# Patient Record
Sex: Female | Born: 1935 | Race: White | Hispanic: No | Marital: Married | Smoking: Never smoker
Health system: Southern US, Community
[De-identification: ages and names within clinical notes are randomized; demographics above are authoritative.]

## PROBLEM LIST (undated history)

## (undated) DIAGNOSIS — I509 Heart failure, unspecified: Secondary | ICD-10-CM

## (undated) DIAGNOSIS — I4891 Unspecified atrial fibrillation: Secondary | ICD-10-CM

## (undated) DIAGNOSIS — E079 Disorder of thyroid, unspecified: Secondary | ICD-10-CM

## (undated) DIAGNOSIS — N289 Disorder of kidney and ureter, unspecified: Secondary | ICD-10-CM

## (undated) DIAGNOSIS — I447 Left bundle-branch block, unspecified: Secondary | ICD-10-CM

---

## 2017-10-27 ENCOUNTER — Emergency Department (HOSPITAL_COMMUNITY)
Admission: EM | Admit: 2017-10-27 | Discharge: 2017-10-27 | Disposition: A | Discharge status: Home / Self Care | Payer: Medicare Other | Attending: Emergency Medicine | Admitting: Emergency Medicine

## 2017-10-27 ENCOUNTER — Emergency Department (HOSPITAL_COMMUNITY): Payer: Medicare Other

## 2017-10-27 ENCOUNTER — Other Ambulatory Visit: Payer: Self-pay

## 2017-10-27 ENCOUNTER — Encounter (HOSPITAL_COMMUNITY): Payer: Self-pay | Admitting: Emergency Medicine

## 2017-10-27 DIAGNOSIS — I509 Heart failure, unspecified: Secondary | ICD-10-CM | POA: Diagnosis not present

## 2017-10-27 DIAGNOSIS — Z79899 Other long term (current) drug therapy: Secondary | ICD-10-CM | POA: Diagnosis not present

## 2017-10-27 DIAGNOSIS — R531 Weakness: Secondary | ICD-10-CM | POA: Diagnosis present

## 2017-10-27 DIAGNOSIS — E860 Dehydration: Secondary | ICD-10-CM | POA: Insufficient documentation

## 2017-10-27 HISTORY — DX: Left bundle-branch block, unspecified: I44.7

## 2017-10-27 HISTORY — DX: Disorder of kidney and ureter, unspecified: N28.9

## 2017-10-27 HISTORY — DX: Unspecified atrial fibrillation: I48.91

## 2017-10-27 HISTORY — DX: Heart failure, unspecified: I50.9

## 2017-10-27 HISTORY — DX: Disorder of thyroid, unspecified: E07.9

## 2017-10-27 LAB — BASIC METABOLIC PANEL
Anion gap: 8 (ref 5–15)
BUN: 31 mg/dL — ABNORMAL HIGH (ref 6–20)
CALCIUM: 9 mg/dL (ref 8.9–10.3)
CO2: 23 mmol/L (ref 22–32)
CREATININE: 1.58 mg/dL — AB (ref 0.44–1.00)
Chloride: 109 mmol/L (ref 101–111)
GFR calc Af Amer: 33 mL/min — ABNORMAL LOW (ref >60)
GFR calc non Af Amer: 28 mL/min — ABNORMAL LOW (ref >60)
GLUCOSE: 126 mg/dL — AB (ref 65–99)
Potassium: 4.8 mmol/L (ref 3.5–5.1)
Sodium: 140 mmol/L (ref 135–145)

## 2017-10-27 LAB — BLOOD GAS, VENOUS
ACID-BASE DEFICIT: 2.2 mmol/L — AB (ref 0.0–2.0)
BICARBONATE: 24.4 mmol/L (ref 20.0–28.0)
O2 Saturation: 53.5 %
PCO2 VEN: 53.4 mmHg (ref 44.0–60.0)
PH VEN: 7.282 (ref 7.250–7.430)
PO2 VEN: 32.2 mmHg (ref 32.0–45.0)
Patient temperature: 98.6

## 2017-10-27 LAB — I-STAT CHEM 8, ED
BUN: 30 mg/dL — AB (ref 6–20)
CALCIUM ION: 1.18 mmol/L (ref 1.15–1.40)
CHLORIDE: 108 mmol/L (ref 101–111)
Creatinine, Ser: 1.6 mg/dL — ABNORMAL HIGH (ref 0.44–1.00)
Glucose, Bld: 128 mg/dL — ABNORMAL HIGH (ref 65–99)
HEMATOCRIT: 32 % — AB (ref 36.0–46.0)
Hemoglobin: 10.9 g/dL — ABNORMAL LOW (ref 12.0–15.0)
POTASSIUM: 4.7 mmol/L (ref 3.5–5.1)
SODIUM: 139 mmol/L (ref 135–145)
TCO2: 24 mmol/L (ref 22–32)

## 2017-10-27 LAB — I-STAT TROPONIN, ED: Troponin i, poc: 0.01 ng/mL (ref 0.00–0.08)

## 2017-10-27 LAB — URINALYSIS, ROUTINE W REFLEX MICROSCOPIC
BILIRUBIN URINE: NEGATIVE
GLUCOSE, UA: NEGATIVE mg/dL
HGB URINE DIPSTICK: NEGATIVE
Ketones, ur: NEGATIVE mg/dL
Leukocytes, UA: NEGATIVE
Nitrite: NEGATIVE
Protein, ur: NEGATIVE mg/dL
SPECIFIC GRAVITY, URINE: 1.016 (ref 1.005–1.030)
pH: 5 (ref 5.0–8.0)

## 2017-10-27 LAB — TSH: TSH: 1.715 u[IU]/mL (ref 0.350–4.500)

## 2017-10-27 LAB — CBG MONITORING, ED: GLUCOSE-CAPILLARY: 119 mg/dL — AB (ref 65–99)

## 2017-10-27 LAB — CBC
HCT: 34.3 % — ABNORMAL LOW (ref 36.0–46.0)
HEMOGLOBIN: 10.9 g/dL — AB (ref 12.0–15.0)
MCH: 31.2 pg (ref 26.0–34.0)
MCHC: 31.8 g/dL (ref 30.0–36.0)
MCV: 98.3 fL (ref 78.0–100.0)
PLATELETS: 245 10*3/uL (ref 150–400)
RBC: 3.49 MIL/uL — ABNORMAL LOW (ref 3.87–5.11)
RDW: 15.1 % (ref 11.5–15.5)
WBC: 6.5 10*3/uL (ref 4.0–10.5)

## 2017-10-27 LAB — DIGOXIN LEVEL: Digoxin Level: 1.4 ng/mL (ref 0.8–2.0)

## 2017-10-27 MED ORDER — SODIUM CHLORIDE 0.9 % IV BOLUS (SEPSIS)
1000.0000 mL | Freq: Once | INTRAVENOUS | Status: AC
  Administered 2017-10-27: 1000 mL via INTRAVENOUS

## 2017-10-27 NOTE — ED Notes (Signed)
Delay in blood and urine collection due to pt not being in room.

## 2017-10-27 NOTE — ED Notes (Signed)
Pt refused to change clothes.

## 2017-10-27 NOTE — ED Notes (Signed)
Pt states she is not claustrophobic and that she is willing to put our hospital gown on for testing. Made Enid Derrythan in MRI aware.

## 2017-10-27 NOTE — ED Provider Notes (Signed)
[facility]-EMERGENCY DEPT Provider Note   CSN: [id number] Arrival date & time: 10/27/17  1626     History   Chief Complaint Chief Complaint  Patient presents with  . Weakness    HPI Lisa Griffin is a 83 y.o. female.  83 yo F with a chief complaint of weakness.  This is been off and on since October.  The patient has been in the hospital twice in [place] for the same.  Initially it was thought to be acute kidney injury she received IV hydration and had improvement and was discharged.  She has back then at her baseline.  A couple weeks later she had a recurrence of her symptoms.  At that time she was found to be in A. fib with RVR.  Was started on digoxin and diltiazem.  She had had improvement until about a week ago.  At that time she was ambulatory on her own and was able to do most things with minimal assistance.  Now the patient is having significant difficulty in ambulating requiring assistance with feeding and cleaning.  They deny fevers or chills.  Denies cough or congestion.  Denies abdominal pain.  The patient did initially have nausea in October though this has since resolved.  She is able to eat though she is eaten and drank significantly less than normal.   The history is provided by the patient, a relative and medical records.  Illness  This is a new problem. The current episode started more than 1 week ago. The problem occurs constantly. The problem has been gradually worsening. Pertinent negatives include no chest pain, no headaches and no shortness of breath. Nothing aggravates the symptoms. Nothing relieves the symptoms. She has tried nothing for the symptoms. The treatment provided no relief.    Past Medical History:  Diagnosis Date  . CHF (congestive heart failure) (HCC)   . LBBB (left bundle branch block)   . Rapid atrial fibrillation (HCC)   . Renal disorder   . Thyroid disease     There are no active problems to display for this  patient.   History reviewed. No pertinent surgical history.  OB History    No data available       Home Medications    Prior to Admission medications   Medication Sig Start Date End Date Taking? Authorizing Provider  benazepril (LOTENSIN) 20 MG tablet Take 20 mg daily by mouth.  07/26/17  Yes [provider]  chlordiazePOXIDE-amitriptyline (LIMBITROL DS) 10-25 MG TABS tablet Take 1 tablet every evening by mouth.   Yes [provider]  Cholecalciferol (VITAMIN D3) 1000 units CAPS Take 1 capsule daily by mouth.    Yes [provider]  digoxin (LANOXIN) 0.125 MG tablet Take 0.125 mg every evening by mouth.  10/14/17  Yes [provider]  diltiazem (TIAZAC) 240 MG 24 hr capsule Take 240 mg daily by mouth.  10/14/17  Yes [provider]  ELIQUIS 2.5 MG TABS tablet Take 2.5 mg 2 (two) times daily by mouth.  10/14/17  Yes [provider]  escitalopram (LEXAPRO) 10 MG tablet Take 10 mg daily by mouth.  09/20/17  Yes [provider]  estradiol (ESTRACE) 2 MG tablet Take 2 mg daily by mouth.  07/31/17  Yes [provider]  gabapentin (NEURONTIN) 100 MG capsule Take 100 mg at bedtime by mouth.  07/26/17  Yes [provider]  HYDROcodone-acetaminophen (NORCO) 10-325 MG tablet Take 1 tablet every 6 (six) hours  as needed by mouth for moderate pain or severe pain.  09/26/17  Yes [provider]  levothyroxine (SYNTHROID, LEVOTHROID) 75 MCG tablet Take 75 mcg daily before breakfast by mouth.   Yes [provider]  omeprazole (PRILOSEC) 40 MG capsule Take 40 mg 2 (two) times daily by mouth.   Yes [provider]  ondansetron (ZOFRAN-ODT) 4 MG disintegrating tablet Take 4 mg every 8 (eight) hours as needed by mouth for nausea or vomiting.  09/13/17  Yes [provider]  polyethylene glycol (MIRALAX / GLYCOLAX) packet Take 17 g daily as needed by mouth for moderate constipation.    Yes [provider]  pravastatin (PRAVACHOL) 20 MG tablet Take 20 mg every evening by mouth.  08/31/17  Yes [provider]  amLODipine (NORVASC) 10 MG tablet Take 10 mg daily by mouth.     [provider]  carbonyl iron (FEOSOL) 45 MG TABS tablet Take by mouth.    [provider]  senna-docusate (SENOKOT-S) 8.6-50 MG tablet Take by mouth. 07/13/15   [provider]  sucralfate (CARAFATE) 1 g tablet  10/07/17   [provider]    Family History No family history on file.  Social History Social History   Tobacco Use  . Smoking status: Never Smoker  Substance Use Topics  . Alcohol use: No    Frequency: Never  . Drug use: No     Allergies   Patient has no known allergies.   Review of Systems Review of Systems  Constitutional: Positive for fatigue. Negative for chills and fever.  HENT: Negative for congestion and rhinorrhea.   Eyes: Negative for redness and visual disturbance.  Respiratory: Negative for shortness of breath and wheezing.   Cardiovascular: Negative for chest pain and palpitations.  Gastrointestinal: Negative for nausea and vomiting.  Genitourinary: Negative for dysuria and urgency.  Musculoskeletal: Negative for arthralgias and myalgias.  Skin: Negative for pallor and wound.  Neurological: Positive for weakness (generalized). Negative for dizziness and headaches.     Physical Exam Updated Vital Signs BP (!) 126/52 (BP Location: Left Wrist)   Pulse 62   Temp 98.1 F (36.7 C) (Oral)   Resp 18   Ht 5\' 5"  (1.651 m)   Wt 59 kg (130 lb)   SpO2 95%   BMI 21.63 kg/m   Physical Exam  Constitutional: She is oriented to person, place, and time. She appears well-developed and well-nourished. No distress.  HENT:  Head: Normocephalic and atraumatic.  Eyes: EOM are normal. Pupils are equal, round, and reactive to light.  Neck: Normal range of motion. Neck supple.  Cardiovascular: Normal rate and regular rhythm. Exam  reveals no gallop and no friction rub.  No murmur heard. Pulmonary/Chest: Effort normal. She has no wheezes. She has no rales.  Abdominal: Soft. She exhibits no distension and no mass. There is no tenderness. There is no guarding.  Musculoskeletal: She exhibits no edema or tenderness.  Neurological: She is alert and oriented to person, place, and time. No cranial nerve deficit or sensory deficit. GCS eye subscore is 4. GCS verbal subscore is 4. GCS motor subscore is 6.  Patient has very straight and determined line from finger to nose however she consistently is to the right and lower than her intended location.  She is also unable to touch her own nose on multiple occasions where she is almost there and then it drops about 1-2 inches and then she touches her lip.  Skin: Skin is  warm and dry. She is not diaphoretic.  Psychiatric: She has a normal mood and affect. Her behavior is normal.  Nursing note and vitals reviewed.    ED Treatments / Results  Labs (all labs ordered are listed, but only abnormal results are displayed) Labs Reviewed  BASIC METABOLIC PANEL - Abnormal; Notable for the following components:      Result Value   Glucose, Bld 126 (*)    BUN 31 (*)    Creatinine, Ser 1.58 (*)    GFR calc non Af Amer 28 (*)    GFR calc Af Amer 33 (*)    All other components within normal limits  CBC - Abnormal; Notable for the following components:   RBC 3.49 (*)    Hemoglobin 10.9 (*)    HCT 34.3 (*)    All other components within normal limits  BLOOD GAS, VENOUS - Abnormal; Notable for the following components:   Acid-base deficit 2.2 (*)    All other components within normal limits  CBG MONITORING, ED - Abnormal; Notable for the following components:   Glucose-Capillary 119 (*)    All other components within normal limits  I-STAT CHEM 8, ED - Abnormal; Notable for the following components:   BUN 30 (*)    Creatinine, Ser 1.60 (*)    Glucose, Bld 128 (*)    Hemoglobin 10.9 (*)      HCT 32.0 (*)    All other components within normal limits  URINALYSIS, ROUTINE W REFLEX MICROSCOPIC  DIGOXIN LEVEL  TSH  T4  I-STAT TROPONIN, ED    EKG  EKG Interpretation  Date/Time:  Thursday October 27 2017 17:02:52 EST Ventricular Rate:  72 PR Interval:    QRS Duration: 138 QT Interval:  411 QTC Calculation: 450 R Axis:   -22 Text Interpretation:  Ectopic atrial rhythm Short PR interval Left bundle branch block No old tracing to compare Confirmed by Melene PlanFloyd, Audreena Sachdeva [phone]([id number]) on 10/27/2017 5:43:31 PM       Radiology Dg Chest 2 View  Result Date: 10/27/2017 CLINICAL DATA:  Weakness EXAM: CHEST  2 VIEW COMPARISON:  None. FINDINGS: Mild elevation of the right diaphragm. Streaky left basilar opacity. No pleural effusion. Mild cardiomegaly. Slight asymmetric fullness of the right hilum could relate to enlarged pulmonary vessels. Atherosclerosis of the aorta. No pneumothorax. Mild wedging at the thoracolumbar junction IMPRESSION: 1. Streaky left lower lobe opacity may reflect atelectasis or mild pneumonia. 2. Cardiomegaly without edema Electronically Signed   By: Jasmine PangKim  Fujinaga M.D.   On: 10/27/2017 18:26   Ct Head Wo Contrast  Result Date: 10/27/2017 CLINICAL DATA:  Progressive weakness with poor appetite EXAM: CT HEAD WITHOUT CONTRAST TECHNIQUE: Contiguous axial images were obtained from the base of the skull through the vertex without intravenous contrast. COMPARISON:  None. FINDINGS: Brain: No acute territorial infarction, hemorrhage or intracranial mass is visualized. Probable old lacunar infarcts within the bilateral basal ganglia. Moderate atrophy. Ventricles are nonenlarged. Vascular: No hyperdense vessels.  Carotid artery calcification. Skull: Normal. Negative for fracture or focal lesion. Sinuses/Orbits: No acute finding. Other: None IMPRESSION: 1. No definite CT evidence for acute intracranial abnormality. 2. Probable old lacunar infarcts in the bilateral basal ganglia. Atrophy.  Electronically Signed   By: Jasmine PangKim  Fujinaga M.D.   On: 10/27/2017 18:31   Mr Brain Wo Contrast  Result Date: 10/27/2017 CLINICAL DATA:  Progressive weakness, poor appetite, unsteady gait. EXAM: MRI HEAD WITHOUT CONTRAST TECHNIQUE: Multiplanar, multiecho pulse sequences of the brain and surrounding structures were  obtained without intravenous contrast. COMPARISON:  CT HEAD October 27, 2017 at 1823 hours FINDINGS: BRAIN: No reduced diffusion to suggest acute ischemia. No susceptibility artifact to suggest hemorrhage. The ventricles and sulci are normal for patient's age. Old bilateral basal ganglia lacunar infarcts. A few scattered subcentimeter supratentorial white matter FLAIR T2 hyperintensities, faint T2 hyperintense signal within the pons. Old small cerebellar infarct . No suspicious parenchymal signal, mass or mass effect. No abnormal extra-axial fluid collections. VASCULAR: Normal major intracranial vascular flow voids present at skull base. SKULL AND UPPER CERVICAL SPINE: No abnormal sellar expansion. No suspicious calvarial bone marrow signal. Craniocervical junction maintained. SINUSES/ORBITS: Trace mastoid effusions. Paranasal sinus are well aerated. The included ocular globes and orbital contents are non-suspicious. Status post bilateral ocular lens implants. OTHER: Patient is edentulous. IMPRESSION: 1. No acute intracranial process. 2. Mild chronic small vessel ischemic disease. 3. 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 Small LEFT cerebellar infarct . Electronically Signed   By: Awilda Metro M.D.   On: 10/27/2017 22:38    Procedures Procedures (including critical care time)  Medications Ordered in ED Medications  sodium chloride 0.9 % bolus 1,000 mL (0 mLs Intravenous Stopped 10/27/17 2003)     Initial Impression / Assessment and Plan / ED Course  I have reviewed the triage vital signs and the nursing notes.  Pertinent labs & imaging results that were available during my care of the  patient were reviewed by me and considered in my medical decision making (see chart for details).     83 yo F with a chief complaint of generalized weakness and change in mentation.  Patient has had a waxing and waning course with this over the past month.  Will obtain labs UA chest x-ray dig level CT of the head give a bolus of IV fluids.  Discussed care with Dr. Otelia Limes, based on my hx recommends MR.   MR negative.  D/c home.   11:10 PM:  I have discussed the diagnosis/risks/treatment options with the patient and family and believe the pt to be eligible for discharge home to follow-up with PCP, neuro. We also discussed returning to the ED immediately if new or worsening sx occur. We discussed the sx which are most concerning (e.g., sudden worsening pain, fever, inability to tolerate by mouth) that necessitate immediate return. Medications administered to the patient during their visit and any new prescriptions provided to the patient are listed below.  Medications given during this visit Medications  sodium chloride 0.9 % bolus 1,000 mL (0 mLs Intravenous Stopped 10/27/17 2003)     The patient appears reasonably screen and/or stabilized for discharge and I doubt any other medical condition or other [facility] requiring further screening, evaluation, or treatment in the ED at this time prior to discharge.    Final Clinical Impressions(s) / ED Diagnoses   Final diagnoses:  Dehydration  Weakness    ED Discharge Orders        Ordered    Ambulatory referral to Neurology    Comments:  Recurrent weakness, difficulty with finger to nose, - MRI   10/27/17 2307       Melene Plan, DO 10/27/17 2310

## 2017-10-27 NOTE — ED Notes (Signed)
ED Provider at bedside. 

## 2017-10-27 NOTE — Discharge Instructions (Signed)
Eat and drink well for the next couple of days.  Return for worsening symptoms.  °

## 2017-10-27 NOTE — ED Notes (Signed)
In and out delay due to registration in the room.

## 2017-10-27 NOTE — ED Notes (Signed)
Pt wheeled to family vehicle.  Family verbalized understanding of discharge instructions.

## 2017-10-27 NOTE — ED Triage Notes (Addendum)
Pt son in law verbalizes pt progressively weak, poor appetite, and unsteady gait; pt alert to self, place, and situation; not oriented to date. Pt denies pain or symptoms but appears fatigue.

## 2017-10-28 LAB — T4: T4 TOTAL: 7.5 ug/dL (ref 4.5–12.0)

## 2017-11-09 ENCOUNTER — Emergency Department (HOSPITAL_COMMUNITY): Payer: Medicare Other

## 2017-11-09 ENCOUNTER — Emergency Department (HOSPITAL_COMMUNITY)
Admission: EM | Admit: 2017-11-09 | Discharge: 2017-11-09 | Disposition: A | Discharge status: Home / Self Care | Payer: Medicare Other | Attending: Emergency Medicine | Admitting: Emergency Medicine

## 2017-11-09 ENCOUNTER — Other Ambulatory Visit: Payer: Self-pay

## 2017-11-09 ENCOUNTER — Encounter (HOSPITAL_COMMUNITY): Payer: Self-pay

## 2017-11-09 DIAGNOSIS — Z7902 Long term (current) use of antithrombotics/antiplatelets: Secondary | ICD-10-CM | POA: Insufficient documentation

## 2017-11-09 DIAGNOSIS — I509 Heart failure, unspecified: Secondary | ICD-10-CM | POA: Diagnosis not present

## 2017-11-09 DIAGNOSIS — J181 Lobar pneumonia, unspecified organism: Secondary | ICD-10-CM | POA: Insufficient documentation

## 2017-11-09 DIAGNOSIS — Z7901 Long term (current) use of anticoagulants: Secondary | ICD-10-CM | POA: Insufficient documentation

## 2017-11-09 DIAGNOSIS — R531 Weakness: Secondary | ICD-10-CM | POA: Diagnosis present

## 2017-11-09 DIAGNOSIS — J189 Pneumonia, unspecified organism: Secondary | ICD-10-CM

## 2017-11-09 DIAGNOSIS — Z79899 Other long term (current) drug therapy: Secondary | ICD-10-CM | POA: Diagnosis not present

## 2017-11-09 LAB — CBC
HCT: 37.2 % (ref 36.0–46.0)
Hemoglobin: 12.1 g/dL (ref 12.0–15.0)
MCH: 31.4 pg (ref 26.0–34.0)
MCHC: 32.5 g/dL (ref 30.0–36.0)
MCV: 96.6 fL (ref 78.0–100.0)
Platelets: 140 10*3/uL — ABNORMAL LOW (ref 150–400)
RBC: 3.85 MIL/uL — ABNORMAL LOW (ref 3.87–5.11)
RDW: 14.7 % (ref 11.5–15.5)
WBC: 6 10*3/uL (ref 4.0–10.5)

## 2017-11-09 LAB — COMPREHENSIVE METABOLIC PANEL
ALT: 12 U/L — ABNORMAL LOW (ref 14–54)
AST: 20 U/L (ref 15–41)
Albumin: 3.1 g/dL — ABNORMAL LOW (ref 3.5–5.0)
Alkaline Phosphatase: 51 U/L (ref 38–126)
Anion gap: 6 (ref 5–15)
BUN: 19 mg/dL (ref 6–20)
CO2: 25 mmol/L (ref 22–32)
Calcium: 8.2 mg/dL — ABNORMAL LOW (ref 8.9–10.3)
Chloride: 109 mmol/L (ref 101–111)
Creatinine, Ser: 1.25 mg/dL — ABNORMAL HIGH (ref 0.44–1.00)
GFR calc Af Amer: 44 mL/min — ABNORMAL LOW (ref >60)
GFR calc non Af Amer: 38 mL/min — ABNORMAL LOW (ref >60)
Glucose, Bld: 100 mg/dL — ABNORMAL HIGH (ref 65–99)
Potassium: 4.4 mmol/L (ref 3.5–5.1)
Sodium: 140 mmol/L (ref 135–145)
Total Bilirubin: 0.9 mg/dL (ref 0.3–1.2)
Total Protein: 6 g/dL — ABNORMAL LOW (ref 6.5–8.1)

## 2017-11-09 LAB — CBG MONITORING, ED: Glucose-Capillary: 88 mg/dL (ref 65–99)

## 2017-11-09 MED ORDER — DEXTROSE 5 % IV SOLN
1.0000 g | Freq: Once | INTRAVENOUS | Status: AC
  Administered 2017-11-09: 1 g via INTRAVENOUS
  Filled 2017-11-09: qty 10

## 2017-11-09 MED ORDER — AZITHROMYCIN 250 MG PO TABS
500.0000 mg | ORAL_TABLET | Freq: Once | ORAL | Status: AC
  Administered 2017-11-09: 500 mg via ORAL
  Filled 2017-11-09: qty 2

## 2017-11-09 MED ORDER — AMOXICILLIN 500 MG PO CAPS
500.0000 mg | ORAL_CAPSULE | Freq: Two times a day (BID) | ORAL | 0 refills | Status: DC
Start: 2017-11-09 — End: 2018-01-07

## 2017-11-09 MED ORDER — AZITHROMYCIN 250 MG PO TABS: 250.0000 mg | ORAL_TABLET | Freq: Every day | ORAL | 0 refills | Status: DC

## 2017-11-09 NOTE — ED Notes (Signed)
Before discharge, assisted patient with using the bed pan. Patient had a bowel movement.

## 2017-11-09 NOTE — ED Triage Notes (Signed)
Pt arrived via EMS. Pt family stated pt was showing signs of dehydration and has hx of dehydration. Pt was showing increased weakness, and confusion. Pt was given 1000 ml of NS enroute and per EMS pt has had improved mental status. Pt family states that pt had a temp of 100.5 today. Pt has had a recent non productive cough. Pt O2 saturation was 89% on RA. Pt was placed on 2 lmp via nasal cannula, and O2 saturations improved to 95%. Pt has hx of AFIB( Eliquis) , CKD, HTN, , Pt has bilateral wounds on bilateral shins.   EMS V/S  BP 174/100, HR 78, RR 18, CBG 96

## 2017-11-09 NOTE — ED Notes (Signed)
Bed: [id number] Expected date:  Expected time:  Means of arrival:  Comments: EMS/confusion/dehydration

## 2017-11-09 NOTE — ED Provider Notes (Signed)
[facility]-EMERGENCY DEPT Provider Note   CSN: [id number] Arrival date & time: 11/09/17  1334     History   Chief Complaint Chief Complaint  Patient presents with  . Altered Mental Status  . Weakness    HPI Lisa Griffin is a 83 y.o. female.  HPI   83 year old female brought in by her son-in-law for evaluation increasing weakness.  He states that she has had these symptoms intermittently for years usually with dehydration.  He also noted a temperature of 100.5 earlier today.  Occasional nonproductive cough.  The past couple days really has not been eating much.  No reported falls or other trauma.  Patient has not been complaining of anything specifically aside from back pain which is chronic for her. No urinary complaints.   Past Medical History:  Diagnosis Date  . CHF (congestive heart failure) (HCC)   . LBBB (left bundle branch block)   . Rapid atrial fibrillation (HCC)   . Renal disorder   . Thyroid disease     There are no active problems to display for this patient.   History reviewed. No pertinent surgical history.  OB History    No data available       Home Medications    Prior to Admission medications   Medication Sig Start Date End Date Taking? Authorizing Provider  amLODipine (NORVASC) 10 MG tablet Take 10 mg daily by mouth.     [provider]  benazepril (LOTENSIN) 20 MG tablet Take 20 mg daily by mouth.  07/26/17   [provider]  carbonyl iron (FEOSOL) 45 MG TABS tablet Take by mouth.    [provider]  chlordiazePOXIDE-amitriptyline (LIMBITROL DS) 10-25 MG TABS tablet Take 1 tablet every evening by mouth.    [provider]  Cholecalciferol (VITAMIN D3) 1000 units CAPS Take 1 capsule daily by mouth.     [provider]  digoxin (LANOXIN) 0.125 MG tablet Take 0.125 mg every evening by mouth.  10/14/17   [provider]  diltiazem (TIAZAC) 240 MG 24 hr capsule Take 240 mg daily  by mouth.  10/14/17   [provider]  ELIQUIS 2.5 MG TABS tablet Take 2.5 mg 2 (two) times daily by mouth.  10/14/17   [provider]  escitalopram (LEXAPRO) 10 MG tablet Take 10 mg daily by mouth.  09/20/17   [provider]  estradiol (ESTRACE) 2 MG tablet Take 2 mg daily by mouth.  07/31/17   [provider]  gabapentin (NEURONTIN) 100 MG capsule Take 100 mg at bedtime by mouth.  07/26/17   [provider]  HYDROcodone-acetaminophen (NORCO) 10-325 MG tablet Take 1 tablet every 6 (six) hours as needed by mouth for moderate pain or severe pain.  09/26/17   [provider]  levothyroxine (SYNTHROID, LEVOTHROID) 75 MCG tablet Take 75 mcg daily before breakfast by mouth.    [provider]  omeprazole (PRILOSEC) 40 MG capsule Take 40 mg 2 (two) times daily by mouth.    [provider]  ondansetron (ZOFRAN-ODT) 4 MG disintegrating tablet Take 4 mg every 8 (eight) hours as needed by mouth for nausea or vomiting.  09/13/17   [provider]  polyethylene glycol (MIRALAX / GLYCOLAX) packet Take 17 g daily as needed by mouth for moderate constipation.     [provider]  pravastatin (PRAVACHOL) 20 MG tablet Take 20 mg every evening by mouth.  08/31/17   [provider]  senna-docusate (SENOKOT-S) 8.6-50  MG tablet Take by mouth. 07/13/15   [provider]  sucralfate (CARAFATE) 1 g tablet  10/07/17   [provider]    Family History History reviewed. No pertinent family history.  Social History Social History   Tobacco Use  . Smoking status: Never Smoker  . Smokeless tobacco: Never Used  Substance Use Topics  . Alcohol use: No    Frequency: Never  . Drug use: No     Allergies   Patient has no known allergies.   Review of Systems Review of Systems All systems reviewed and negative, other than as noted in HPI.   Physical Exam Updated Vital Signs BP (!) 154/54 (BP Location:  Right Arm)   Pulse 79   Temp 98.8 F (37.1 C) (Oral)   Resp 20   SpO2 94%   Physical Exam  Constitutional: She appears well-developed and well-nourished. No distress.  HENT:  Head: Normocephalic and atraumatic.  Eyes: Conjunctivae are normal. Right eye exhibits no discharge. Left eye exhibits no discharge.  Neck: Neck supple.  Cardiovascular: Normal rate and normal heart sounds. Exam reveals no gallop and no friction rub.  No murmur heard. Pulmonary/Chest: Effort normal and breath sounds normal. No respiratory distress.  Abdominal: Soft. She exhibits no distension. There is no tenderness.  Genitourinary:  Genitourinary Comments: Lower extremities symmetric as compared to each other. No calf tenderness. Negative Homan's. No palpable cords.   Musculoskeletal: She exhibits no edema or tenderness.  Neurological:  Sleepy, but opens eyes to voice.  Answers questions.  Follows commands.  No focal deficit noted.  Skin: Skin is warm and dry.  Small wounds which is essentially amount to ulcerations to the anterior left shin and over the lateral malleolus of the right ankle.  Neither appear infected.  Psychiatric: She has a normal mood and affect. Her behavior is normal. Thought content normal.  Nursing note and vitals reviewed.    ED Treatments / Results  Labs (all labs ordered are listed, but only abnormal results are displayed) Labs Reviewed  COMPREHENSIVE METABOLIC PANEL - Abnormal; Notable for the following components:      Result Value   Glucose, Bld 100 (*)    Creatinine, Ser 1.25 (*)    Calcium 8.2 (*)    Total Protein 6.0 (*)    Albumin 3.1 (*)    ALT 12 (*)    GFR calc non Af Amer 38 (*)    GFR calc Af Amer 44 (*)    All other components within normal limits  CBC - Abnormal; Notable for the following components:   RBC 3.85 (*)    Platelets 140 (*)    All other components within normal limits  CBG MONITORING, ED    EKG  EKG Interpretation  Date/Time:  Wednesday  November 09 2017 14:03:44 EST Ventricular Rate:  72 PR Interval:    QRS Duration: 155 QT Interval:  385 QTC Calculation: 422 R Axis:   12 Text Interpretation:  Atrial fibrillation Artifact Left bundle branch block Confirmed by Raeford RazorKohut, Kensly Bowmer [phone]([id number]) on 11/09/2017 2:58:29 PM       Radiology Dg Chest 2 View  Result Date: 11/09/2017 CLINICAL DATA:  Dehydration, weakness, low grade temperature. Cough. Dyspnea. EXAM: CHEST  2 VIEW COMPARISON:  Two-view chest x-ray 10/27/2017 FINDINGS: The heart is enlarged. Diffuse interstitial and airspace disease is now present. Small effusions are present. Airspace disease is most concentrated in the left lower lobe. The visualized soft tissues and bony thorax are unremarkable. IMPRESSION: 1.  Left lower lobe pneumonia. 2. Increase in diffuse interstitial and airspace disease likely representing some component of edema and possibly congestive heart failure as well. Electronically Signed   By: Marin Roberts M.D.   On: 11/09/2017 15:23    Procedures Procedures (including critical care time)  Medications Ordered in ED Medications - No data to display   Initial Impression / Assessment and Plan / ED Course  I have reviewed the triage vital signs and the nursing notes.  Pertinent labs & imaging results that were available during my care of the patient were reviewed by me and considered in my medical decision making (see chart for details).     89yF with decreased mental status. Reported fever to 100.5. Occasional cough. CXR with likely pneumonia. Offered admission. Son-in-law declining. To his credit, he would like trying to take care of her at home. o2 sats mildly low in mid 90s on RA, but seems to be breathing comfortably. Renal function improved from last labs. No vomiting. Will DC after dose of abx in ED. Return precautions were discussed.   Final Clinical Impressions(s) / ED Diagnoses   Final diagnoses:  Community acquired pneumonia of left  lower lobe of lung [facility])    ED Discharge Orders        Ordered    azithromycin (ZITHROMAX) 250 MG tablet  Daily     11/09/17 1600    amoxicillin (AMOXIL) 500 MG capsule  2 times daily     11/09/17 1600       Raeford Razor, MD 11/10/17 [phone]

## 2018-01-04 ENCOUNTER — Encounter (HOSPITAL_COMMUNITY): Payer: Self-pay | Admitting: Emergency Medicine

## 2018-01-04 ENCOUNTER — Emergency Department (HOSPITAL_COMMUNITY): Payer: Medicare Other

## 2018-01-04 ENCOUNTER — Emergency Department (HOSPITAL_COMMUNITY)
Admission: EM | Admit: 2018-01-04 | Discharge: 2018-01-04 | Discharge status: Against Medical Advice | Payer: Medicare Other | Source: Home / Self Care

## 2018-01-04 DIAGNOSIS — Z5321 Procedure and treatment not carried out due to patient leaving prior to being seen by health care provider: Secondary | ICD-10-CM

## 2018-01-04 DIAGNOSIS — E871 Hypo-osmolality and hyponatremia: Secondary | ICD-10-CM | POA: Diagnosis not present

## 2018-01-04 DIAGNOSIS — R0602 Shortness of breath: Secondary | ICD-10-CM | POA: Diagnosis not present

## 2018-01-04 NOTE — ED Triage Notes (Signed)
Patient has productive cough for 10 days. Reports sputum today become really dark and patient gets almost choked when coughing really hard. Family reports that not taking fluids as should so could be dehydrated. Is prone to getting PNA per son in law.

## 2018-01-04 NOTE — ED Notes (Signed)
Pt informed registration clerk they were leaving

## 2018-01-05 ENCOUNTER — Inpatient Hospital Stay (HOSPITAL_COMMUNITY)
Admission: EM | Admit: 2018-01-05 | Discharge: 2018-01-07 | DRG: 641 | Disposition: A | Discharge status: Home Health | Payer: Medicare Other | Attending: Internal Medicine | Admitting: Internal Medicine

## 2018-01-05 ENCOUNTER — Observation Stay (HOSPITAL_COMMUNITY): Payer: Medicare Other

## 2018-01-05 ENCOUNTER — Encounter (HOSPITAL_COMMUNITY): Payer: Self-pay | Admitting: Emergency Medicine

## 2018-01-05 ENCOUNTER — Other Ambulatory Visit: Payer: Self-pay

## 2018-01-05 ENCOUNTER — Ambulatory Visit: Payer: Medicare Other | Admitting: Podiatry

## 2018-01-05 DIAGNOSIS — Z7901 Long term (current) use of anticoagulants: Secondary | ICD-10-CM

## 2018-01-05 DIAGNOSIS — R0602 Shortness of breath: Secondary | ICD-10-CM | POA: Diagnosis not present

## 2018-01-05 DIAGNOSIS — E785 Hyperlipidemia, unspecified: Secondary | ICD-10-CM | POA: Diagnosis present

## 2018-01-05 DIAGNOSIS — R059 Cough, unspecified: Secondary | ICD-10-CM

## 2018-01-05 DIAGNOSIS — I5032 Chronic diastolic (congestive) heart failure: Secondary | ICD-10-CM | POA: Diagnosis present

## 2018-01-05 DIAGNOSIS — R339 Retention of urine, unspecified: Secondary | ICD-10-CM | POA: Diagnosis present

## 2018-01-05 DIAGNOSIS — J209 Acute bronchitis, unspecified: Secondary | ICD-10-CM | POA: Diagnosis present

## 2018-01-05 DIAGNOSIS — Z885 Allergy status to narcotic agent status: Secondary | ICD-10-CM

## 2018-01-05 DIAGNOSIS — E878 Other disorders of electrolyte and fluid balance, not elsewhere classified: Secondary | ICD-10-CM

## 2018-01-05 DIAGNOSIS — R05 Cough: Secondary | ICD-10-CM | POA: Diagnosis not present

## 2018-01-05 DIAGNOSIS — I4892 Unspecified atrial flutter: Secondary | ICD-10-CM | POA: Diagnosis present

## 2018-01-05 DIAGNOSIS — Z8709 Personal history of other diseases of the respiratory system: Secondary | ICD-10-CM

## 2018-01-05 DIAGNOSIS — E860 Dehydration: Secondary | ICD-10-CM | POA: Diagnosis present

## 2018-01-05 DIAGNOSIS — Z888 Allergy status to other drugs, medicaments and biological substances status: Secondary | ICD-10-CM

## 2018-01-05 DIAGNOSIS — N189 Chronic kidney disease, unspecified: Secondary | ICD-10-CM | POA: Diagnosis present

## 2018-01-05 DIAGNOSIS — I480 Paroxysmal atrial fibrillation: Secondary | ICD-10-CM | POA: Diagnosis present

## 2018-01-05 DIAGNOSIS — I482 Chronic atrial fibrillation: Secondary | ICD-10-CM | POA: Diagnosis present

## 2018-01-05 DIAGNOSIS — I130 Hypertensive heart and chronic kidney disease with heart failure and stage 1 through stage 4 chronic kidney disease, or unspecified chronic kidney disease: Secondary | ICD-10-CM | POA: Diagnosis present

## 2018-01-05 DIAGNOSIS — E871 Hypo-osmolality and hyponatremia: Principal | ICD-10-CM | POA: Diagnosis present

## 2018-01-05 DIAGNOSIS — Z79890 Hormone replacement therapy (postmenopausal): Secondary | ICD-10-CM

## 2018-01-05 DIAGNOSIS — H9190 Unspecified hearing loss, unspecified ear: Secondary | ICD-10-CM | POA: Diagnosis present

## 2018-01-05 DIAGNOSIS — E039 Hypothyroidism, unspecified: Secondary | ICD-10-CM | POA: Diagnosis present

## 2018-01-05 DIAGNOSIS — I447 Left bundle-branch block, unspecified: Secondary | ICD-10-CM | POA: Diagnosis present

## 2018-01-05 LAB — CBC WITH DIFFERENTIAL/PLATELET
BASOS ABS: 0 10*3/uL (ref 0.0–0.1)
Basophils Relative: 0 %
Eosinophils Absolute: 0.1 10*3/uL (ref 0.0–0.7)
Eosinophils Relative: 1 %
HCT: 32.9 % — ABNORMAL LOW (ref 36.0–46.0)
Hemoglobin: 11.3 g/dL — ABNORMAL LOW (ref 12.0–15.0)
LYMPHS PCT: 24 %
Lymphs Abs: 1.8 10*3/uL (ref 0.7–4.0)
MCH: 30.4 pg (ref 26.0–34.0)
MCHC: 34.3 g/dL (ref 30.0–36.0)
MCV: 88.4 fL (ref 78.0–100.0)
Monocytes Absolute: 0.4 10*3/uL (ref 0.1–1.0)
Monocytes Relative: 6 %
Neutro Abs: 5.2 10*3/uL (ref 1.7–7.7)
Neutrophils Relative %: 69 %
Platelets: 159 10*3/uL (ref 150–400)
RBC: 3.72 MIL/uL — ABNORMAL LOW (ref 3.87–5.11)
RDW: 13.9 % (ref 11.5–15.5)
WBC: 7.5 10*3/uL (ref 4.0–10.5)

## 2018-01-05 LAB — BASIC METABOLIC PANEL
ANION GAP: 5 (ref 5–15)
ANION GAP: 5 (ref 5–15)
Anion gap: 7 (ref 5–15)
BUN: 15 mg/dL (ref 6–20)
BUN: 14 mg/dL (ref 6–20)
BUN: 12 mg/dL (ref 6–20)
CALCIUM: 8.5 mg/dL — AB (ref 8.9–10.3)
CHLORIDE: 96 mmol/L — AB (ref 101–111)
CHLORIDE: 97 mmol/L — AB (ref 101–111)
CO2: 24 mmol/L (ref 22–32)
CO2: 23 mmol/L (ref 22–32)
CO2: 23 mmol/L (ref 22–32)
Calcium: 8.4 mg/dL — ABNORMAL LOW (ref 8.9–10.3)
Calcium: 8.3 mg/dL — ABNORMAL LOW (ref 8.9–10.3)
Chloride: 93 mmol/L — ABNORMAL LOW (ref 101–111)
Creatinine, Ser: 1.04 mg/dL — ABNORMAL HIGH (ref 0.44–1.00)
Creatinine, Ser: 0.94 mg/dL (ref 0.44–1.00)
Creatinine, Ser: 0.97 mg/dL (ref 0.44–1.00)
GFR calc Af Amer: 54 mL/min — ABNORMAL LOW (ref >60)
GFR calc Af Amer: >60 mL/min (ref >60)
GFR calc Af Amer: 59 mL/min — ABNORMAL LOW (ref >60)
GFR calc non Af Amer: 53 mL/min — ABNORMAL LOW (ref >60)
GFR, EST NON AFRICAN AMERICAN: 47 mL/min — AB (ref >60)
GFR, EST NON AFRICAN AMERICAN: 51 mL/min — AB (ref >60)
GLUCOSE: 118 mg/dL — AB (ref 65–99)
GLUCOSE: 136 mg/dL — AB (ref 65–99)
Glucose, Bld: 140 mg/dL — ABNORMAL HIGH (ref 65–99)
POTASSIUM: 4.2 mmol/L (ref 3.5–5.1)
POTASSIUM: 4.6 mmol/L (ref 3.5–5.1)
Potassium: 5 mmol/L (ref 3.5–5.1)
Sodium: 124 mmol/L — ABNORMAL LOW (ref 135–145)
Sodium: 124 mmol/L — ABNORMAL LOW (ref 135–145)
Sodium: 125 mmol/L — ABNORMAL LOW (ref 135–145)

## 2018-01-05 LAB — I-STAT TROPONIN, ED: Troponin i, poc: 0.02 ng/mL (ref 0.00–0.08)

## 2018-01-05 LAB — BRAIN NATRIURETIC PEPTIDE: B NATRIURETIC PEPTIDE 5: 193.8 pg/mL — AB (ref 0.0–100.0)

## 2018-01-05 LAB — COMPREHENSIVE METABOLIC PANEL
ALT: 15 U/L (ref 14–54)
AST: 22 U/L (ref 15–41)
Albumin: 3.2 g/dL — ABNORMAL LOW (ref 3.5–5.0)
Alkaline Phosphatase: 64 U/L (ref 38–126)
Anion gap: 8 (ref 5–15)
BUN: 16 mg/dL (ref 6–20)
CHLORIDE: 89 mmol/L — AB (ref 101–111)
CO2: 24 mmol/L (ref 22–32)
Calcium: 8.9 mg/dL (ref 8.9–10.3)
Creatinine, Ser: 1.1 mg/dL — ABNORMAL HIGH (ref 0.44–1.00)
GFR, EST AFRICAN AMERICAN: 51 mL/min — AB (ref >60)
GFR, EST NON AFRICAN AMERICAN: 44 mL/min — AB (ref >60)
Glucose, Bld: 113 mg/dL — ABNORMAL HIGH (ref 65–99)
POTASSIUM: 4.7 mmol/L (ref 3.5–5.1)
Sodium: 121 mmol/L — ABNORMAL LOW (ref 135–145)
Total Bilirubin: 0.8 mg/dL (ref 0.3–1.2)
Total Protein: 6.1 g/dL — ABNORMAL LOW (ref 6.5–8.1)

## 2018-01-05 LAB — INFLUENZA PANEL BY PCR (TYPE A & B)
Influenza A By PCR: NEGATIVE
Influenza B By PCR: NEGATIVE

## 2018-01-05 LAB — I-STAT CG4 LACTIC ACID, ED
LACTIC ACID, VENOUS: 0.96 mmol/L (ref 0.5–1.9)
LACTIC ACID, VENOUS: 1.5 mmol/L (ref 0.5–1.9)

## 2018-01-05 MED ORDER — VITAMIN D 1000 UNITS PO TABS
1000.0000 [IU] | ORAL_TABLET | Freq: Every day | ORAL | Status: DC
  Administered 2018-01-05 – 2018-01-07 (×3): 1000 [IU] via ORAL
  Filled 2018-01-05 (×3): qty 1

## 2018-01-05 MED ORDER — DILTIAZEM HCL ER BEADS 240 MG PO CP24
240.0000 mg | ORAL_CAPSULE | Freq: Every day | ORAL | Status: DC
  Administered 2018-01-05 – 2018-01-06 (×2): 240 mg via ORAL
  Filled 2018-01-05 (×4): qty 1

## 2018-01-05 MED ORDER — ISOSORBIDE MONONITRATE ER 30 MG PO TB24
30.0000 mg | ORAL_TABLET | Freq: Every day | ORAL | Status: DC
  Administered 2018-01-05 – 2018-01-07 (×3): 30 mg via ORAL
  Filled 2018-01-05 (×3): qty 1

## 2018-01-05 MED ORDER — CARBONYL IRON 45 MG PO TABS
45.0000 mg | ORAL_TABLET | Freq: Every day | ORAL | Status: DC
  Filled 2018-01-05: qty 1

## 2018-01-05 MED ORDER — DIGOXIN 125 MCG PO TABS
0.1250 mg | ORAL_TABLET | Freq: Every evening | ORAL | Status: DC
  Administered 2018-01-05 – 2018-01-06 (×2): 0.125 mg via ORAL
  Filled 2018-01-05 (×3): qty 1

## 2018-01-05 MED ORDER — HYDRALAZINE HCL 20 MG/ML IJ SOLN: 10.0000 mg | Freq: Four times a day (QID) | INTRAMUSCULAR | Status: DC | PRN

## 2018-01-05 MED ORDER — ONDANSETRON 4 MG PO TBDP: 4.0000 mg | ORAL_TABLET | Freq: Three times a day (TID) | ORAL | Status: DC | PRN

## 2018-01-05 MED ORDER — GUAIFENESIN ER 600 MG PO TB12
600.0000 mg | ORAL_TABLET | Freq: Two times a day (BID) | ORAL | Status: DC
  Administered 2018-01-05 – 2018-01-07 (×4): 600 mg via ORAL
  Filled 2018-01-05 (×5): qty 1

## 2018-01-05 MED ORDER — HYDRALAZINE HCL 25 MG PO TABS
25.0000 mg | ORAL_TABLET | Freq: Three times a day (TID) | ORAL | Status: DC
  Administered 2018-01-05 – 2018-01-07 (×6): 25 mg via ORAL
  Filled 2018-01-05 (×8): qty 1

## 2018-01-05 MED ORDER — ALBUTEROL SULFATE (2.5 MG/3ML) 0.083% IN NEBU: 5.0000 mg | INHALATION_SOLUTION | RESPIRATORY_TRACT | Status: DC | PRN

## 2018-01-05 MED ORDER — DEXTROSE 5 % IV SOLN
1.0000 g | Freq: Once | INTRAVENOUS | Status: AC
  Administered 2018-01-05: 1 g via INTRAVENOUS
  Filled 2018-01-05: qty 10

## 2018-01-05 MED ORDER — ONDANSETRON HCL 4 MG PO TABS
4.0000 mg | ORAL_TABLET | Freq: Four times a day (QID) | ORAL | Status: DC | PRN
  Administered 2018-01-05: 4 mg via ORAL
  Filled 2018-01-05: qty 1

## 2018-01-05 MED ORDER — BENAZEPRIL HCL 20 MG PO TABS
20.0000 mg | ORAL_TABLET | Freq: Every day | ORAL | Status: DC
  Administered 2018-01-05 – 2018-01-07 (×3): 20 mg via ORAL
  Filled 2018-01-05 (×3): qty 1

## 2018-01-05 MED ORDER — AMLODIPINE BESYLATE 5 MG PO TABS: 10.0000 mg | ORAL_TABLET | Freq: Every day | ORAL | Status: DC

## 2018-01-05 MED ORDER — IPRATROPIUM-ALBUTEROL 0.5-2.5 (3) MG/3ML IN SOLN: 3.0000 mL | Freq: Four times a day (QID) | RESPIRATORY_TRACT | Status: DC | PRN

## 2018-01-05 MED ORDER — ALBUTEROL SULFATE (2.5 MG/3ML) 0.083% IN NEBU
5.0000 mg | INHALATION_SOLUTION | Freq: Once | RESPIRATORY_TRACT | Status: AC
  Administered 2018-01-05: 5 mg via RESPIRATORY_TRACT
  Filled 2018-01-05: qty 6

## 2018-01-05 MED ORDER — SODIUM CHLORIDE 0.9 % IV SOLN: 250.0000 mL | INTRAVENOUS | Status: DC | PRN

## 2018-01-05 MED ORDER — POLYETHYLENE GLYCOL 3350 17 G PO PACK: 17.0000 g | PACK | Freq: Every day | ORAL | Status: DC | PRN

## 2018-01-05 MED ORDER — SODIUM CHLORIDE 0.9 % IV BOLUS (SEPSIS)
1000.0000 mL | Freq: Once | INTRAVENOUS | Status: AC
  Administered 2018-01-05: 1000 mL via INTRAVENOUS

## 2018-01-05 MED ORDER — PRAVASTATIN SODIUM 20 MG PO TABS
20.0000 mg | ORAL_TABLET | Freq: Every evening | ORAL | Status: DC
  Administered 2018-01-05 – 2018-01-06 (×2): 20 mg via ORAL
  Filled 2018-01-05 (×3): qty 1

## 2018-01-05 MED ORDER — AZITHROMYCIN 250 MG PO TABS
250.0000 mg | ORAL_TABLET | Freq: Every day | ORAL | Status: DC
  Administered 2018-01-06 – 2018-01-07 (×2): 250 mg via ORAL
  Filled 2018-01-05 (×2): qty 1

## 2018-01-05 MED ORDER — HYDROCODONE-ACETAMINOPHEN 10-325 MG PO TABS
1.0000 | ORAL_TABLET | Freq: Four times a day (QID) | ORAL | Status: DC | PRN
  Administered 2018-01-05 – 2018-01-06 (×5): 1 via ORAL
  Filled 2018-01-05 (×5): qty 1

## 2018-01-05 MED ORDER — ACETAMINOPHEN 650 MG RE SUPP: 650.0000 mg | Freq: Four times a day (QID) | RECTAL | Status: DC | PRN

## 2018-01-05 MED ORDER — ALBUTEROL SULFATE (2.5 MG/3ML) 0.083% IN NEBU: 2.5000 mg | INHALATION_SOLUTION | RESPIRATORY_TRACT | Status: DC | PRN

## 2018-01-05 MED ORDER — AZITHROMYCIN 250 MG PO TABS: 250.0000 mg | ORAL_TABLET | Freq: Every day | ORAL | Status: DC

## 2018-01-05 MED ORDER — TRAZODONE HCL 50 MG PO TABS: 50.0000 mg | ORAL_TABLET | Freq: Every evening | ORAL | Status: DC | PRN

## 2018-01-05 MED ORDER — ONDANSETRON HCL 4 MG/2ML IJ SOLN
4.0000 mg | Freq: Four times a day (QID) | INTRAMUSCULAR | Status: DC | PRN
  Administered 2018-01-06 (×2): 4 mg via INTRAVENOUS
  Filled 2018-01-05 (×3): qty 2

## 2018-01-05 MED ORDER — FERROUS SULFATE 325 (65 FE) MG PO TABS
325.0000 mg | ORAL_TABLET | Freq: Every day | ORAL | Status: DC
  Administered 2018-01-05 – 2018-01-07 (×3): 325 mg via ORAL
  Filled 2018-01-05 (×3): qty 1

## 2018-01-05 MED ORDER — LEVOTHYROXINE SODIUM 50 MCG PO TABS
75.0000 ug | ORAL_TABLET | Freq: Every day | ORAL | Status: DC
  Administered 2018-01-06 – 2018-01-07 (×2): 75 ug via ORAL
  Filled 2018-01-05 (×2): qty 1

## 2018-01-05 MED ORDER — DEXTROSE 5 % IV SOLN
500.0000 mg | Freq: Once | INTRAVENOUS | Status: AC
  Administered 2018-01-05: 500 mg via INTRAVENOUS
  Filled 2018-01-05: qty 500

## 2018-01-05 MED ORDER — PANTOPRAZOLE SODIUM 40 MG PO TBEC
40.0000 mg | DELAYED_RELEASE_TABLET | Freq: Every day | ORAL | Status: DC
  Administered 2018-01-06 – 2018-01-07 (×2): 40 mg via ORAL
  Filled 2018-01-05 (×3): qty 1

## 2018-01-05 MED ORDER — PREDNISONE 50 MG PO TABS
50.0000 mg | ORAL_TABLET | Freq: Once | ORAL | Status: DC
  Filled 2018-01-05: qty 1

## 2018-01-05 MED ORDER — ACETAMINOPHEN 325 MG PO TABS
650.0000 mg | ORAL_TABLET | Freq: Four times a day (QID) | ORAL | Status: DC | PRN
  Filled 2018-01-05: qty 2

## 2018-01-05 MED ORDER — SODIUM CHLORIDE 0.9 % IV SOLN
INTRAVENOUS | Status: DC
  Administered 2018-01-05 – 2018-01-06 (×3): via INTRAVENOUS

## 2018-01-05 MED ORDER — SENNA 8.6 MG PO TABS
1.0000 | ORAL_TABLET | Freq: Two times a day (BID) | ORAL | Status: DC
  Administered 2018-01-07: 8.6 mg via ORAL
  Filled 2018-01-05 (×3): qty 1

## 2018-01-05 MED ORDER — ESTRADIOL 1 MG PO TABS
2.0000 mg | ORAL_TABLET | Freq: Every day | ORAL | Status: DC
  Administered 2018-01-05 – 2018-01-07 (×3): 2 mg via ORAL
  Filled 2018-01-05 (×3): qty 2

## 2018-01-05 MED ORDER — APIXABAN 2.5 MG PO TABS
2.5000 mg | ORAL_TABLET | Freq: Two times a day (BID) | ORAL | Status: DC
  Administered 2018-01-05 – 2018-01-07 (×5): 2.5 mg via ORAL
  Filled 2018-01-05 (×5): qty 1

## 2018-01-05 MED ORDER — ESCITALOPRAM OXALATE 10 MG PO TABS
10.0000 mg | ORAL_TABLET | Freq: Every day | ORAL | Status: DC
  Administered 2018-01-06 – 2018-01-07 (×2): 10 mg via ORAL
  Filled 2018-01-05 (×3): qty 1

## 2018-01-05 MED ORDER — SODIUM CHLORIDE 0.9% FLUSH: 3.0000 mL | Freq: Two times a day (BID) | INTRAVENOUS | Status: DC

## 2018-01-05 MED ORDER — SODIUM CHLORIDE 0.9% FLUSH: 3.0000 mL | INTRAVENOUS | Status: DC | PRN

## 2018-01-05 MED ORDER — IPRATROPIUM-ALBUTEROL 0.5-2.5 (3) MG/3ML IN SOLN
3.0000 mL | Freq: Four times a day (QID) | RESPIRATORY_TRACT | Status: DC
  Administered 2018-01-05: 3 mL via RESPIRATORY_TRACT
  Filled 2018-01-05: qty 3

## 2018-01-05 MED ORDER — GABAPENTIN 100 MG PO CAPS
100.0000 mg | ORAL_CAPSULE | Freq: Every day | ORAL | Status: DC
  Administered 2018-01-05 – 2018-01-06 (×2): 100 mg via ORAL
  Filled 2018-01-05 (×2): qty 1

## 2018-01-05 NOTE — ED Notes (Signed)
Patient has a dry cough. Patient thinks she is choking but patient is talking as she is coughing. Patient O2 Sat -99%.

## 2018-01-05 NOTE — Care Management Obs Status (Signed)
MEDICARE OBSERVATION STATUS NOTIFICATION   Patient Details  Name: Lisa DupontJuanita Griffin MRN: [id number] Date of Birth: 1928/11/25   Medicare Observation Status Notification Given:  Yes    Demaryius Imran, Lynnae Sandhoffngela N, RN 01/05/2018, 2:15 PM

## 2018-01-05 NOTE — Progress Notes (Signed)
C/o IV in RAC hurting.NS infusing no s/s infiltration blood return noted.Is leaking small amt

## 2018-01-05 NOTE — ED Triage Notes (Signed)
Patient was here earlier and had a chest xray/EKG. Patient left because they did not want to wait. Patient is back because she is complaining sob. Patient is on room air O2 Sat- 98%. Patient is laying in bed with no signs of respiratory distress.

## 2018-01-05 NOTE — Progress Notes (Signed)
Placed new IV and removed RAC 20 Gauge no redness /injury noted.

## 2018-01-05 NOTE — ED Notes (Signed)
[facility] Expected date:  Expected time:  Means of arrival:  Comments: EMS 84 yo female wants chest xray results SOB

## 2018-01-05 NOTE — Care Management Note (Signed)
Case Management Note  Patient Details  Name: Lisa Griffin MRN: [id number] Date of Birth: 25-Oct-1928  Subjective/Objective:84 y/o f admitted w/hyponatremia. From home. CM referral for rehab. PT cons-await recc. Notified CSW also.                    Action/Plan:d/c plan home w/HHC.   Expected Discharge Date:  (unknown)               Expected Discharge Plan:  Home w Home Health Services  In-House Referral:  Clinical Social Work  Discharge planning Services  CM Consult  Post Acute Care Choice:    Choice offered to:     DME Arranged:    DME Agency:     HH Arranged:    HH Agency:     Status of Service:  In process, will continue to follow  If discussed at Long Length of Stay Meetings, dates discussed:    Additional Comments:  Lanier ClamMahabir, Leonte Horrigan, RN 01/05/2018, 2:46 PM

## 2018-01-05 NOTE — Progress Notes (Signed)
PT Cancellation Note  Patient Details Name: Lisa DupontJuanita Griffin MRN: [id number] DOB: 04-Mar-1928   Cancelled Treatment:    Reason Eval/Treat Not Completed: Medical issues which prohibited therapy;Fatigue/lethargy limiting ability to participate, Nursing reports patient just went to sleep, is agitated with attempts to move in bed. Reportedly to be moved to floor. Will check back another time.    Rada HayHill, Nahsir Venezia Elizabeth 01/05/2018, 11:20 AM Blanchard KelchKaren Detrick Dani PT [phone]

## 2018-01-05 NOTE — H&P (Addendum)
Patient Demographics:    Lisa Griffin, is a 84 y.o. female  MRN: [id number]   DOB - Apr 28, 1928  Admit Date - 01/05/2018  Outpatient Primary MD for the patient is Patient, No Pcp Per   Assessment & Plan:    Active Problems:   Hyponatremia  1)Symptomatic hyponatremia-this is a recurrent problem for patient, suspect due to combination of dehydration in the setting of poor oral intake compounded by Lexapro use, family will talk to PCP for discharge to see if Lexapro can be weaned off. We discussed ways to  increase fluids/solute intake with family, check serial BMP and adjust IV fluid composition and rate accordingly.  Creatinine closely  2)HFpEF-patient with history of chronic diastolic CHF, last known EF 60-65% based on echo from July 2017, clinically and radiologically no evidence of chronic diastolic CHF exacerbation at this time, BNP noted, okay to hydrate given that patient is currently dehydrated.   3)Afib/Flutter-continue digoxin and Cardizem for rate control, continue Eliquis for anticoagulation,   4)Hypothyroidism-stable, TSH was 1.7 in November 2018, continue levothyroxine  5)Social/Ethics-advanced directive discussed with patient and son-in-law at bedside, at this time patient is a full code  6)HTN-okay to continue Cardizem, add isosorbide/hydralazine for better BP control  7) acute bronchitis-apparently patient was wheezing earlier with rhonchi and productive cough, influenza test is negative, chest x-ray without focal findings, okay to treat with bronchodilators, mucolytics and azithromycin  With History of - Reviewed by me  Past Medical History:  Diagnosis Date  . CHF (congestive heart failure) (HCC)   . LBBB (left bundle branch block)   . Rapid atrial fibrillation (HCC)   . Renal disorder   .  Thyroid disease       History reviewed. No pertinent surgical history.    Chief Complaint  Patient presents with  . Shortness of Breath      HPI:    Lisa Griffin  is a 84 y.o. female  with PMHx relevant for HFpEF, A. fib/A flutter, hypothyroidism,  left bundle branch block, as well as recurrent episodes of symptomatic hyponatremia presents to the ED with concerns about fatigue, malaise, generalized weakness as well as cough and URI symptoms for the last 3-4 days.  No fevers, no vomiting, no diarrhea, no recent diuretic use  In ED patient is found to have a sodium of 120, review of records from [facility] in [place] and [facility] reveals recurrent episodes of symptomatic hyponatremia.  Please note the patient is on Lexapro which may contribute to hyponatremia, patient's son-in-law Maurine Minister admits that patient oral intake is typically poor  No chest pains, no palpitations, no falls,   In ED.... After IV fluids patient's sodium is up to 124 from 120  In ED ... Patient has persistent cough with scant yellow/tan sputum, chest x-ray x2 done within 24 hours of each other without definite pneumonia  Most of the history is obtained from patient's son-in-law Maurine Minister at bedside as patient  is a poor historian due to cognitive deficits apparently is worse than her baseline due to dehydration and hyponatremia at this time   Review of systems:    In addition to the HPI above,   A full 12 point Review of 10 Systems was done, except as stated above, all other Review of 10 Systems were negative.    Social History:  Reviewed by me    Social History   Tobacco Use  . Smoking status: Never Smoker  . Smokeless tobacco: Never Used  Substance Use Topics  . Alcohol use: No    Frequency: Never       Family History :  Reviewed by me   History reviewed. No pertinent family history. HTN   Home Medications:   Prior to Admission medications   Medication Sig Start Date  End Date Taking? Authorizing Provider  amLODipine (NORVASC) 10 MG tablet Take 10 mg daily by mouth.    Yes [provider]  benazepril (LOTENSIN) 20 MG tablet Take 20 mg daily by mouth.  07/26/17  Yes [provider]  chlordiazePOXIDE-amitriptyline (LIMBITROL DS) 10-25 MG TABS tablet Take 1 tablet every evening by mouth.   Yes [provider]  Cholecalciferol (VITAMIN D3) 1000 units CAPS Take 1 capsule daily by mouth.    Yes [provider]  digoxin (LANOXIN) 0.125 MG tablet Take 0.125 mg every evening by mouth.  10/14/17  Yes [provider]  diltiazem (TIAZAC) 240 MG 24 hr capsule Take 240 mg daily by mouth.  10/14/17  Yes [provider]  ELIQUIS 2.5 MG TABS tablet Take 2.5 mg 2 (two) times daily by mouth.  10/14/17  Yes [provider]  escitalopram (LEXAPRO) 10 MG tablet Take 10 mg daily by mouth.  09/20/17  Yes [provider]  estradiol (ESTRACE) 2 MG tablet Take 2 mg daily by mouth.  07/31/17  Yes [provider]  gabapentin (NEURONTIN) 100 MG capsule Take 100 mg at bedtime by mouth.  07/26/17  Yes [provider]  HYDROcodone-acetaminophen (NORCO) 10-325 MG tablet Take 1 tablet every 6 (six) hours as needed by mouth for moderate pain or severe pain.  09/26/17  Yes [provider]  levothyroxine (SYNTHROID, LEVOTHROID) 75 MCG tablet Take 75 mcg daily before breakfast by mouth.   Yes [provider]  omeprazole (PRILOSEC) 40 MG capsule Take 40 mg 2 (two) times daily by mouth.   Yes [provider]  ondansetron (ZOFRAN-ODT) 4 MG disintegrating tablet Take 4 mg every 8 (eight) hours as needed by mouth for nausea or vomiting.  09/13/17  Yes [provider]  polyethylene glycol (MIRALAX / GLYCOLAX) packet Take 17 g daily as needed by mouth for moderate constipation.    Yes [provider]  pravastatin (PRAVACHOL) 20 MG tablet Take 20 mg every evening by mouth.  08/31/17   Yes [provider]  amoxicillin (AMOXIL) 500 MG capsule Take 1 capsule (500 mg total) by mouth 2 (two) times daily. Patient not taking: Reported on 01/05/2018 11/09/17   Raeford RazorKohut, Stephen, MD  azithromycin (ZITHROMAX) 250 MG tablet Take 1 tablet (250 mg total) by mouth daily. 1 every day until finished. Had first dose in ER. Patient not taking: Reported on 01/05/2018 11/09/17   Raeford RazorKohut, Stephen, MD  carbonyl iron (FEOSOL) 45 MG TABS tablet Take by mouth.    [provider]     Allergies:     Allergies  Allergen Reactions  . Metoclopramide Hives  . Promethazine Hives  .  Codeine Nausea Only     Physical Exam:   Vitals  Blood pressure (!) 134/52, pulse 66, temperature 98.3 F (36.8 C), temperature source Oral, resp. rate 15, height 5\' 5"  (1.651 m), weight 59 kg (130 lb), SpO2 100 %.  Physical Examination: General appearance - alert, well appearing, and in no distress Mental status - alert, cognitive deficits noted, patient intermittently confused and disoriented Eyes - sclera anicteric Neck - supple, no JVD elevation , Chest -scattered rhonchi, and movement is fair, I do not appreciate any wheezing at this time Heart - S1 and S2 normal, irregularly irregular Abdomen - soft, nontender, nondistended, no CVA tenderness Neurological - cognitive deficits noted, patient intermittently confused and disoriented  neck supple without rigidity, cranial nerves II through XII intact, DTR's normal and symmetric, generalized weakness and debility without new focal neurological deficits Extremities -  intact peripheral pulses , chronic lower extremity edema which according to son-in-law is better than baseline, chronic venous stasis Skin - warm, dry Psych-cognitive deficits and episodes of disorientation   Data Review:    CBC Recent Labs  Lab 01/05/18 0427  WBC 7.5  HGB 11.3*  HCT 32.9*  PLT 159  MCV 88.4  MCH 30.4  MCHC 34.3  RDW 13.9  LYMPHSABS 1.8  MONOABS 0.4    EOSABS 0.1  BASOSABS 0.0   ------------------------------------------------------------------------------------------------------------------  Chemistries  Recent Labs  Lab 01/05/18 0427 01/05/18 0646  NA 121* 124*  K 4.7 4.2  CL 89* 93*  CO2 24 24  GLUCOSE 113* 140*  BUN 16 15  CREATININE 1.10* 1.04*  CALCIUM 8.9 8.5*  AST 22  --   ALT 15  --   ALKPHOS 64  --   BILITOT 0.8  --    ------------------------------------------------------------------------------------------------------------------ estimated creatinine clearance is 34.3 mL/min (A) (by C-G formula based on SCr of 1.04 mg/dL (H)). ------------------------------------------------------------------------------------------------------------------ No results for input(s): TSH, T4TOTAL, T3FREE, THYROIDAB in the last 72 hours.  Invalid input(s): FREET3   Coagulation profile No results for input(s): INR, PROTIME in the last 168 hours. ------------------------------------------------------------------------------------------------------------------- No results for input(s): DDIMER in the last 72 hours. -------------------------------------------------------------------------------------------------------------------  Cardiac Enzymes No results for input(s): CKMB, TROPONINI, MYOGLOBIN in the last 168 hours.  Invalid input(s): CK ------------------------------------------------------------------------------------------------------------------    Component Value Date/Time   BNP 193.8 (H) 01/05/2018 0427     ---------------------------------------------------------------------------------------------------------------  Urinalysis    Component Value Date/Time   COLORURINE YELLOW 10/27/2017 1918   APPEARANCEUR CLEAR 10/27/2017 1918   LABSPEC 1.016 10/27/2017 1918   PHURINE 5.0 10/27/2017 1918   GLUCOSEU NEGATIVE 10/27/2017 1918   HGBUR NEGATIVE 10/27/2017 1918   BILIRUBINUR NEGATIVE 10/27/2017 1918    KETONESUR NEGATIVE 10/27/2017 1918   PROTEINUR NEGATIVE 10/27/2017 1918   NITRITE NEGATIVE 10/27/2017 1918   LEUKOCYTESUR NEGATIVE 10/27/2017 1918    ----------------------------------------------------------------------------------------------------------------   Imaging Results:    Dg Chest 2 View  Result Date: 01/04/2018 CLINICAL DATA:  Productive cough for 10 days EXAM: CHEST  2 VIEW COMPARISON:  11/09/2017 FINDINGS: There is no focal consolidation. There is mild left lower lobe atelectasis. There is no pleural effusion or pneumothorax. There is stable cardiomegaly. There is a small hiatal hernia. The osseous structures are unremarkable. IMPRESSION: No active cardiopulmonary disease. Electronically Signed   By: Elige Ko   On: 01/04/2018 19:23   Dg Chest Port 1 View  Result Date: 01/05/2018 CLINICAL DATA:  Shortness of breath EXAM: PORTABLE CHEST 1 VIEW COMPARISON:  PA and lateral chest x-ray of January 04, 2018 and November 09, 2017. FINDINGS: The lungs are adequately inflated. There is linear increased density in the mid and lower lung zones bilaterally. There is no alveolar infiltrate or pleural effusion. The heart is mildly enlarged. The pulmonary vascularity is not engorged. There calcification in the wall of the aortic arch. The observed bony thorax exhibits no acute abnormality. IMPRESSION: Subsegmental atelectasis bilaterally. No alveolar pneumonia nor pulmonary edema. Mild cardiac enlargement which is not a new finding. Thoracic aortic atherosclerosis. Electronically Signed   By: David  [place] M.D.   On: 01/05/2018 07:35    Radiological Exams on Admission: Dg Chest 2 View  Result Date: 01/04/2018 CLINICAL DATA:  Productive cough for 10 days EXAM: CHEST  2 VIEW COMPARISON:  11/09/2017 FINDINGS: There is no focal consolidation. There is mild left lower lobe atelectasis. There is no pleural effusion or pneumothorax. There is stable cardiomegaly. There is a small hiatal hernia. The  osseous structures are unremarkable. IMPRESSION: No active cardiopulmonary disease. Electronically Signed   By: Elige Ko   On: 01/04/2018 19:23   Dg Chest Port 1 View  Result Date: 01/05/2018 CLINICAL DATA:  Shortness of breath EXAM: PORTABLE CHEST 1 VIEW COMPARISON:  PA and lateral chest x-ray of January 04, 2018 and November 09, 2017. FINDINGS: The lungs are adequately inflated. There is linear increased density in the mid and lower lung zones bilaterally. There is no alveolar infiltrate or pleural effusion. The heart is mildly enlarged. The pulmonary vascularity is not engorged. There calcification in the wall of the aortic arch. The observed bony thorax exhibits no acute abnormality. IMPRESSION: Subsegmental atelectasis bilaterally. No alveolar pneumonia nor pulmonary edema. Mild cardiac enlargement which is not a new finding. Thoracic aortic atherosclerosis. Electronically Signed   By: David  [place] M.D.   On: 01/05/2018 07:35    DVT Prophylaxis -SCD  /Eliquis AM Labs Ordered, also please review Full Orders  Family Communication: Admission, patients condition and plan of care including tests being ordered have been discussed with the patient and son in law Maurine Minister * who indicate understanding and agree with the plan   Code Status - Full Code  Likely DC to  Home   Condition   stable  Shon Hale M.D on 01/05/2018 at 8:43 AM   Between 7am to 7pm - Pager - [phone] After 7pm go to [email or web address] - password [email or web address]  [facility] - Office  [phone]  Voice Recognition Reubin Milan dictation system was used to create this note, attempts have been made to correct errors. Please contact the author with questions and/or clarifications.

## 2018-01-05 NOTE — ED Provider Notes (Signed)
[facility]-EMERGENCY DEPT Provider Note   CSN: [id number] Arrival date & time: 01/05/18  0124     History   Chief Complaint Chief Complaint  Patient presents with  . Shortness of Breath    HPI Lisa Griffin is a 84 y.o. female with a hx of HF, left bundle branch block, A. fib, chronic kidney disease, thyroid disease presents to the Emergency Department complaining of gradual, persistent, progressively worsening productive cough onset 2 days ago.  Patient and son-in-law at bedside report the cough has been productive of yellow sputum occasionally streaked with brown.  Son reports patient has been sick for several days.  She lives at home with her daughter and son-in-law who have also had URI symptoms.  Patient did not receive a flu shot this year.  She reports generalized body aches as well.  She denies chest pain, abdominal pain, nausea, vomiting, diarrhea, weakness, dizziness, syncope.  Patient does additionally endorses some shortness of breath.  Son-in-law reports that patient has been breathing without difficulty today and has been speaking in full sentences.  Patient has never smoked.  Patient and son-in-law state no dyspnea on exertion and no increased swelling of her legs.   The history is provided by the patient and medical records. No language interpreter was used.    Past Medical History:  Diagnosis Date  . CHF (congestive heart failure) (HCC)   . LBBB (left bundle branch block)   . Rapid atrial fibrillation (HCC)   . Renal disorder   . Thyroid disease     There are no active problems to display for this patient.   History reviewed. No pertinent surgical history.  OB History    No data available       Home Medications    Prior to Admission medications   Medication Sig Start Date End Date Taking? Authorizing Provider  amLODipine (NORVASC) 10 MG tablet Take 10 mg daily by mouth.    Yes [provider]  benazepril (LOTENSIN) 20 MG  tablet Take 20 mg daily by mouth.  07/26/17  Yes [provider]  chlordiazePOXIDE-amitriptyline (LIMBITROL DS) 10-25 MG TABS tablet Take 1 tablet every evening by mouth.   Yes [provider]  Cholecalciferol (VITAMIN D3) 1000 units CAPS Take 1 capsule daily by mouth.    Yes [provider]  digoxin (LANOXIN) 0.125 MG tablet Take 0.125 mg every evening by mouth.  10/14/17  Yes [provider]  diltiazem (TIAZAC) 240 MG 24 hr capsule Take 240 mg daily by mouth.  10/14/17  Yes [provider]  ELIQUIS 2.5 MG TABS tablet Take 2.5 mg 2 (two) times daily by mouth.  10/14/17  Yes [provider]  escitalopram (LEXAPRO) 10 MG tablet Take 10 mg daily by mouth.  09/20/17  Yes [provider]  estradiol (ESTRACE) 2 MG tablet Take 2 mg daily by mouth.  07/31/17  Yes [provider]  gabapentin (NEURONTIN) 100 MG capsule Take 100 mg at bedtime by mouth.  07/26/17  Yes [provider]  HYDROcodone-acetaminophen (NORCO) 10-325 MG tablet Take 1 tablet every 6 (six) hours as needed by mouth for moderate pain or severe pain.  09/26/17  Yes [provider]  levothyroxine (SYNTHROID, LEVOTHROID) 75 MCG tablet Take 75 mcg daily before breakfast by mouth.   Yes [provider]  omeprazole (PRILOSEC) 40 MG capsule Take 40 mg 2 (two) times daily by mouth.   Yes [provider]  ondansetron (ZOFRAN-ODT) 4 MG disintegrating  tablet Take 4 mg every 8 (eight) hours as needed by mouth for nausea or vomiting.  09/13/17  Yes [provider]  polyethylene glycol (MIRALAX / GLYCOLAX) packet Take 17 g daily as needed by mouth for moderate constipation.    Yes [provider]  pravastatin (PRAVACHOL) 20 MG tablet Take 20 mg every evening by mouth.  08/31/17  Yes [provider]  amoxicillin (AMOXIL) 500 MG capsule Take 1 capsule (500 mg total) by mouth 2 (two) times daily. Patient not taking: Reported on  01/05/2018 11/09/17   Raeford RazorKohut, Stephen, MD  azithromycin (ZITHROMAX) 250 MG tablet Take 1 tablet (250 mg total) by mouth daily. 1 every day until finished. Had first dose in ER. Patient not taking: Reported on 01/05/2018 11/09/17   Raeford RazorKohut, Stephen, MD  carbonyl iron (FEOSOL) 45 MG TABS tablet Take by mouth.    [provider]    Family History History reviewed. No pertinent family history.  Social History Social History   Tobacco Use  . Smoking status: Never Smoker  . Smokeless tobacco: Never Used  Substance Use Topics  . Alcohol use: No    Frequency: Never  . Drug use: No     Allergies   Metoclopramide; Promethazine; and Codeine   Review of Systems Review of Systems  Constitutional: Negative for appetite change, diaphoresis, fatigue, fever and unexpected weight change.  HENT: Negative for mouth sores.   Eyes: Negative for visual disturbance.  Respiratory: Positive for shortness of breath. Negative for cough, chest tightness and wheezing.   Cardiovascular: Negative for chest pain.  Gastrointestinal: Negative for abdominal pain, constipation, diarrhea, nausea and vomiting.  Endocrine: Negative for polydipsia, polyphagia and polyuria.  Genitourinary: Negative for dysuria, frequency, hematuria and urgency.  Musculoskeletal: Negative for back pain and neck stiffness.  Skin: Negative for rash.  Allergic/Immunologic: Negative for immunocompromised state.  Neurological: Negative for syncope, light-headedness and headaches.  Hematological: Does not bruise/bleed easily.  Psychiatric/Behavioral: Negative for sleep disturbance. The patient is not nervous/anxious.      Physical Exam Updated Vital Signs BP (!) 134/52 (BP Location: Left Wrist)   Pulse 66   Temp 98.3 F (36.8 C) (Oral)   Resp 15   Ht 5\' 5"  (1.651 m)   Wt 59 kg (130 lb)   SpO2 100%   BMI 21.63 kg/m   Physical Exam  Constitutional: She appears well-developed and well-nourished. No distress.  Awake,  alert, nontoxic appearance  HENT:  Head: Normocephalic and atraumatic.  Mouth/Throat: Oropharynx is clear and moist. No oropharyngeal exudate.  Eyes: Conjunctivae are normal. No scleral icterus.  Neck: Normal range of motion. Neck supple.  Cardiovascular: Normal rate, regular rhythm and intact distal pulses.  Pulmonary/Chest: Effort normal. Tachypnea noted. No respiratory distress. She has wheezes. She has rhonchi.  Equal chest expansion Wheezing and rhonchi bilaterally but worse on the left Cough productive of brown and yellow sputum  Abdominal: Soft. Bowel sounds are normal. She exhibits no mass. There is no tenderness. There is no rebound and no guarding.  Musculoskeletal: Normal range of motion. She exhibits edema ( 2+ pitting edema of the bilateral lower extremities).  Neurological: She is alert.  Speech is clear and goal oriented Moves extremities without ataxia  Skin: Skin is warm and dry. She is not diaphoretic.  Lower extremities are slightly erythematous with scaling.  No increased warmth.  Psychiatric: She has a normal mood and affect.  Nursing note and vitals reviewed.    ED Treatments / Results  Labs (all  labs ordered are listed, but only abnormal results are displayed) Labs Reviewed  CBC WITH DIFFERENTIAL/PLATELET - Abnormal; Notable for the following components:      Result Value   RBC 3.72 (*)    Hemoglobin 11.3 (*)    HCT 32.9 (*)    All other components within normal limits  COMPREHENSIVE METABOLIC PANEL - Abnormal; Notable for the following components:   Sodium 121 (*)    Chloride 89 (*)    Glucose, Bld 113 (*)    Creatinine, Ser 1.10 (*)    Total Protein 6.1 (*)    Albumin 3.2 (*)    GFR calc non Af Amer 44 (*)    GFR calc Af Amer 51 (*)    All other components within normal limits  BRAIN NATRIURETIC PEPTIDE - Abnormal; Notable for the following components:   B Natriuretic Peptide 193.8 (*)    All other components within normal limits  INFLUENZA PANEL  BY PCR (TYPE A & B)  I-STAT CG4 LACTIC ACID, ED  I-STAT TROPONIN, ED  I-STAT CG4 LACTIC ACID, ED    EKG  EKG Interpretation  Date/Time:  Thursday January 05 2018 04:32:33 EST Ventricular Rate:  58 PR Interval:    QRS Duration: 142 QT Interval:  403 QTC Calculation: 396 R Axis:   27 Text Interpretation:  Sinus or ectopic atrial rhythm Left bundle branch block Confirmed by Nicanor Alcon, April ([id number]) on 01/05/2018 4:44:19 AM       Radiology Dg Chest 2 View  Result Date: 01/04/2018 CLINICAL DATA:  Productive cough for 10 days EXAM: CHEST  2 VIEW COMPARISON:  11/09/2017 FINDINGS: There is no focal consolidation. There is mild left lower lobe atelectasis. There is no pleural effusion or pneumothorax. There is stable cardiomegaly. There is a small hiatal hernia. The osseous structures are unremarkable. IMPRESSION: No active cardiopulmonary disease. Electronically Signed   By: Elige Ko   On: 01/04/2018 19:23    Procedures Procedures (including critical care time)  Medications Ordered in ED Medications  cefTRIAXone (ROCEPHIN) 1 g in dextrose 5 % 50 mL IVPB (not administered)  azithromycin (ZITHROMAX) 500 mg in dextrose 5 % 250 mL IVPB (not administered)  albuterol (PROVENTIL) (2.5 MG/3ML) 0.083% nebulizer solution 5 mg (5 mg Nebulization Given 01/05/18 0455)  sodium chloride 0.9 % bolus 1,000 mL (0 mLs Intravenous Stopped 01/05/18 0607)  albuterol (PROVENTIL) (2.5 MG/3ML) 0.083% nebulizer solution 5 mg (5 mg Nebulization Given 01/05/18 0610)     Initial Impression / Assessment and Plan / ED Course  I have reviewed the triage vital signs and the nursing notes.  Pertinent labs & imaging results that were available during my care of the patient were reviewed by me and considered in my medical decision making (see chart for details).  Clinical Course as of Jan 05 633  Thu Jan 05, 2018  0500 Breath sounds improved after albuterol however she continues to have congested cough and  left-sided rhonchi.  [HM]  [phone] Discussed with Dr. Katrinka Blazing who will admit.    [HM]  [phone] Much lower than normal. Sodium: (!) 121 [HM]  0628 Likely baseline B Natriuretic Peptide: (!) 193.8 [HM]  0628 Baseline Creatinine: (!) 1.10 [HM]  0629 Negative influenza Influenza A By PCR: NEGATIVE [HM]    Clinical Course User Index [HM] Tekeisha Hakim, Dahlia Client, PA-C    Patient presents with cough for the last several days.  Son-in-law reports that she is often dehydrated.  Subjective fevers at home but no measured fever.  Patient  does have a history of CHF.  No increased swelling of her legs.  Chest x-ray without pneumonia or pulmonary edema.  BNP slightly elevated however I suspect this is baseline.  Patient with significant hyponatremia and hypochloremia.  Fluids given.  Patient will need admission for further evaluation and treatment of this.  Influenza was negative.  Will treat for possible pneumonia.    The patient was discussed with and seen by Dr. Nicanor Alcon who agrees with the treatment plan.   Final Clinical Impressions(s) / ED Diagnoses   Final diagnoses:  Shortness of breath  Cough  Hyponatremia  Hypochloremia    ED Discharge Orders    None       Milta Deiters 01/05/18 1610    Palumbo, April, MD 01/05/18 0700

## 2018-01-05 NOTE — ED Notes (Signed)
Per pt son-in-law, pt refused to have blood pressure checked via hospital device r/t pain. Pt son-in-law requested that pt BP be taken with home device for comfort reasons. This RN explained that numerical values of BP device from home could not be documented. AD and charge made aware of pt B/P refusal.

## 2018-01-05 NOTE — ED Notes (Signed)
Pt retaining 900 cc of fluid in bladder. Made hospitalist aware and obtained order for I&O

## 2018-01-05 NOTE — Plan of Care (Addendum)
[name] is a 84 year old female with pmh of hyponatremia and intermittent weakness; who presented with cough and subjective fevers.  Labs revealed sodium of 121 and negative influenza screen.  Chest x-ray from 01/04/2015, negative for any acute abnormality.  Lungs lung exam was reported to have significant rhonchi.  Vital signs otherwise stable.  Patient was given 1 L of IV fluids and 2 albuterol breathing treatments.  TRH called to admit.  Rechecking BMP  and order a repeat chest x-ray to see if pneumonia fluffs out as patient given fluids then suspected to be dehydrated.  Admit as observation to a MedSurg.

## 2018-01-06 DIAGNOSIS — I4892 Unspecified atrial flutter: Secondary | ICD-10-CM | POA: Diagnosis present

## 2018-01-06 DIAGNOSIS — E860 Dehydration: Secondary | ICD-10-CM | POA: Diagnosis present

## 2018-01-06 DIAGNOSIS — I482 Chronic atrial fibrillation: Secondary | ICD-10-CM

## 2018-01-06 DIAGNOSIS — Z7901 Long term (current) use of anticoagulants: Secondary | ICD-10-CM | POA: Diagnosis not present

## 2018-01-06 DIAGNOSIS — Z885 Allergy status to narcotic agent status: Secondary | ICD-10-CM | POA: Diagnosis not present

## 2018-01-06 DIAGNOSIS — E878 Other disorders of electrolyte and fluid balance, not elsewhere classified: Secondary | ICD-10-CM | POA: Diagnosis present

## 2018-01-06 DIAGNOSIS — E785 Hyperlipidemia, unspecified: Secondary | ICD-10-CM | POA: Diagnosis present

## 2018-01-06 DIAGNOSIS — Z888 Allergy status to other drugs, medicaments and biological substances status: Secondary | ICD-10-CM | POA: Diagnosis not present

## 2018-01-06 DIAGNOSIS — E871 Hypo-osmolality and hyponatremia: Secondary | ICD-10-CM | POA: Diagnosis present

## 2018-01-06 DIAGNOSIS — H9190 Unspecified hearing loss, unspecified ear: Secondary | ICD-10-CM | POA: Diagnosis present

## 2018-01-06 DIAGNOSIS — Z8709 Personal history of other diseases of the respiratory system: Secondary | ICD-10-CM | POA: Diagnosis not present

## 2018-01-06 DIAGNOSIS — R339 Retention of urine, unspecified: Secondary | ICD-10-CM | POA: Diagnosis present

## 2018-01-06 DIAGNOSIS — I130 Hypertensive heart and chronic kidney disease with heart failure and stage 1 through stage 4 chronic kidney disease, or unspecified chronic kidney disease: Secondary | ICD-10-CM | POA: Diagnosis present

## 2018-01-06 DIAGNOSIS — R0602 Shortness of breath: Secondary | ICD-10-CM | POA: Diagnosis present

## 2018-01-06 DIAGNOSIS — I5032 Chronic diastolic (congestive) heart failure: Secondary | ICD-10-CM | POA: Diagnosis present

## 2018-01-06 DIAGNOSIS — I480 Paroxysmal atrial fibrillation: Secondary | ICD-10-CM | POA: Diagnosis present

## 2018-01-06 DIAGNOSIS — I447 Left bundle-branch block, unspecified: Secondary | ICD-10-CM | POA: Diagnosis present

## 2018-01-06 DIAGNOSIS — R05 Cough: Secondary | ICD-10-CM | POA: Diagnosis not present

## 2018-01-06 DIAGNOSIS — Z79890 Hormone replacement therapy (postmenopausal): Secondary | ICD-10-CM | POA: Diagnosis not present

## 2018-01-06 DIAGNOSIS — N189 Chronic kidney disease, unspecified: Secondary | ICD-10-CM | POA: Diagnosis present

## 2018-01-06 DIAGNOSIS — J209 Acute bronchitis, unspecified: Secondary | ICD-10-CM | POA: Diagnosis present

## 2018-01-06 DIAGNOSIS — E039 Hypothyroidism, unspecified: Secondary | ICD-10-CM | POA: Diagnosis present

## 2018-01-06 LAB — CBC
HEMATOCRIT: 26.4 % — AB (ref 36.0–46.0)
Hemoglobin: 9 g/dL — ABNORMAL LOW (ref 12.0–15.0)
MCH: 30.4 pg (ref 26.0–34.0)
MCHC: 34.1 g/dL (ref 30.0–36.0)
MCV: 89.2 fL (ref 78.0–100.0)
PLATELETS: 129 10*3/uL — AB (ref 150–400)
RBC: 2.96 MIL/uL — ABNORMAL LOW (ref 3.87–5.11)
RDW: 14.5 % (ref 11.5–15.5)
WBC: 4 10*3/uL (ref 4.0–10.5)

## 2018-01-06 LAB — BASIC METABOLIC PANEL
Anion gap: 6 (ref 5–15)
Anion gap: 4 — ABNORMAL LOW (ref 5–15)
BUN: 10 mg/dL (ref 6–20)
BUN: 10 mg/dL (ref 6–20)
CHLORIDE: 99 mmol/L — AB (ref 101–111)
CHLORIDE: 102 mmol/L (ref 101–111)
CO2: 22 mmol/L (ref 22–32)
CO2: 22 mmol/L (ref 22–32)
CREATININE: 0.89 mg/dL (ref 0.44–1.00)
Calcium: 8.3 mg/dL — ABNORMAL LOW (ref 8.9–10.3)
Calcium: 8.4 mg/dL — ABNORMAL LOW (ref 8.9–10.3)
Creatinine, Ser: 0.92 mg/dL (ref 0.44–1.00)
GFR calc Af Amer: >60 mL/min (ref >60)
GFR calc non Af Amer: 57 mL/min — ABNORMAL LOW (ref >60)
GFR calc non Af Amer: 54 mL/min — ABNORMAL LOW (ref >60)
GLUCOSE: 100 mg/dL — AB (ref 65–99)
Glucose, Bld: 102 mg/dL — ABNORMAL HIGH (ref 65–99)
POTASSIUM: 4.5 mmol/L (ref 3.5–5.1)
Potassium: 4.7 mmol/L (ref 3.5–5.1)
SODIUM: 127 mmol/L — AB (ref 135–145)
SODIUM: 128 mmol/L — AB (ref 135–145)

## 2018-01-06 LAB — URINALYSIS, ROUTINE W REFLEX MICROSCOPIC
Bilirubin Urine: NEGATIVE
Glucose, UA: NEGATIVE mg/dL
HGB URINE DIPSTICK: NEGATIVE
Ketones, ur: NEGATIVE mg/dL
Leukocytes, UA: NEGATIVE
NITRITE: NEGATIVE
PH: 6 (ref 5.0–8.0)
Protein, ur: NEGATIVE mg/dL
SPECIFIC GRAVITY, URINE: 1.003 — AB (ref 1.005–1.030)

## 2018-01-06 MED ORDER — TAMSULOSIN HCL 0.4 MG PO CAPS
0.4000 mg | ORAL_CAPSULE | Freq: Every day | ORAL | Status: DC
  Administered 2018-01-06 – 2018-01-07 (×2): 0.4 mg via ORAL
  Filled 2018-01-06 (×2): qty 1

## 2018-01-06 NOTE — Progress Notes (Signed)
Pt's daughter selected [facility], MD will [facility].

## 2018-01-06 NOTE — Evaluation (Signed)
Physical Therapy Evaluation Patient Details Name: Lisa Griffin MRN: [id number] DOB: May 28, 1928 Today's Date: 01/06/2018   History of Present Illness  Lisa Griffin  is a 84 y.o. female  with PMHx relevant for HF, A. fib/A flutter, hypothyroidism,  left bundle branch block, as well as recurrent episodes of symptomatic hyponatremia presents to the ED with concerns about fatigue, malaise, generalized weakness as well as cough and URI symptoms   Clinical Impression  Pt admitted with above diagnosis. Pt currently with functional limitations due to the deficits listed below (see PT Problem List).  Pt agreeable to OOB, declines to amb, declines use of RW; pt is mildly dyspneic with transfers, VSS;   Pt will benefit from skilled PT to increase their independence and safety with mobility to allow discharge to the venue listed below.  Recommend HHPT however dtr declines at this time and states pt will likely not be cooperative, will continue to follow and assess for needs     Follow Up Recommendations Home health PT(however dtr declines at time of eval)    Equipment Recommendations  Rolling walker with 5" wheels(pt may refuse)    Recommendations for Other Services       Precautions / Restrictions Precautions Precautions: Fall      Mobility  Bed Mobility Overal bed mobility: Needs Assistance Bed Mobility: Supine to Sit     Supine to sit: Min assist     General bed mobility comments: assist with trunk to upright--dtr assisting pt; once upright pt able to self assist to scoot to EOB with verbal cues  Transfers Overall transfer level: Needs assistance Equipment used: 1 person hand held assist Transfers: Sit to/from Stand;Stand Pivot Transfers Sit to Stand: Min guard Stand pivot transfers: Min assist       General transfer comment: pt refused to use RW; HHA for balance for pivotal steps/fwd/back  from bed to recliner; verbal cues for safety  Ambulation/Gait             General  Gait Details: pt refused to amb, refused to use RW--stating her back hurts and she can't do it (agreeable to OOB)  Stairs            Wheelchair Mobility    Modified Rankin (Stroke Patients Only)       Balance Overall balance assessment: Needs assistance Sitting-balance support: No upper extremity supported;Feet supported Sitting balance-Leahy Scale: Fair     Standing balance support: Single extremity supported;During functional activity Standing balance-Leahy Scale: Poor Standing balance comment: reliant on UE support                             Pertinent Vitals/Pain Pain Assessment: No/denies pain    Home Living Family/patient expects to be discharged to:: Private residence Living Arrangements: Children;Other relatives Available Help at Discharge: Family;Available 24 hours/day Type of Home: House Home Access: Stairs to enter   [employer] CorporationEntrance Stairs-Number of Steps: 3-4 Home Layout: Two level;Able to live on main level with bedroom/bathroom Home Equipment: None Additional Comments: dtr and her husband assist pt and pt's husband; pt husband is amb with RW; dtr reports her mother refuses to use walker or cane    Prior Function Level of Independence: Independent               Hand Dominance        Extremity/Trunk Assessment   Upper Extremity Assessment Upper Extremity Assessment: Generalized weakness    Lower Extremity Assessment Lower  Extremity Assessment: Generalized weakness       Communication   Communication: HOH  Cognition Arousal/Alertness: Awake/alert Behavior During Therapy: WFL for tasks assessed/performed Overall Cognitive Status: Within Functional Limits for tasks assessed                                 General Comments: appears WFL, dtr answers questions regarding home environment d/t pt very [facility]      General Comments      Exercises     Assessment/Plan    PT Assessment Patient needs continued PT  services  PT Problem List Decreased strength;Decreased activity tolerance;Decreased balance;Decreased mobility;Decreased knowledge of use of DME       PT Treatment Interventions DME instruction;Gait training;Functional mobility training;Therapeutic activities;Therapeutic exercise;Patient/family education    PT Goals (Current goals can be found in the Care Plan section)  Acute Rehab PT Goals Patient Stated Goal: not stated PT Goal Formulation: With patient/family Time For Goal Achievement: 01/13/18 Potential to Achieve Goals: Good    Frequency Min 3X/week   Barriers to discharge        Co-evaluation               AM-PAC PT "6 Clicks" Daily Activity  Outcome Measure Difficulty turning over in bed (including adjusting bedclothes, sheets and blankets)?: A Little Difficulty moving from lying on back to sitting on the side of the bed? : Unable Difficulty sitting down on and standing up from a chair with arms (e.g., wheelchair, bedside commode, etc,.)?: A Little Help needed moving to and from a bed to chair (including a wheelchair)?: A Little Help needed walking in hospital room?: A Little Help needed climbing 3-5 steps with a railing? : A Little 6 Click Score: 16    End of Session Equipment Utilized During Treatment: Gait belt Activity Tolerance: Patient tolerated treatment well Patient left: in chair;with call bell/phone within reach;with family/visitor present   PT Visit Diagnosis: Difficulty in walking, not elsewhere classified (R26.2)    Time: 1191-4782 PT Time Calculation (min) (ACUTE ONLY): 20 min   Charges:   PT Evaluation $PT Eval Low Complexity: 1 Low     PT G CodesDrucilla Chalet, PT Pager: [phone] 01/06/2018'   Drucilla Chalet 01/06/2018, 12:06 PM

## 2018-01-06 NOTE — Care Management Note (Signed)
Case Management Note  Patient Details  Name: Lisa DupontJuanita Griffin MRN: [id number] Date of Birth: 1928-04-27  Subjective/Objective:                    Action/Plan:   Expected Discharge Date:  (unknown)               Expected Discharge Plan:  Home w Home Health Services  In-House Referral:  Clinical Social Work  Discharge planning Services  CM Consult  Post Acute Care Choice:    Choice offered to:     DME Arranged:    DME Agency:     HH Arranged:  RN HH Agency:  [facility]  Status of Service:  In process, will continue to follow  If discussed at Long Length of Stay Meetings, dates discussed:    Additional CommentsGeni Bers:  Dreshaun Stene, RN 01/06/2018, 4:25 PM

## 2018-01-06 NOTE — Progress Notes (Addendum)
PROGRESS NOTE  Lisa Griffin ZOX:[id number] DOB: 08-10-1928 DOA: 01/05/2018 PCP: Patient, No Pcp Per   LOS: 0 days   Brief Narrative / Interim history: 84 year old female with history of chronic diastolic heart failure, atrial fibrillation on anticoagulation, chronic hyponatremia who presented to the hospital with poor oral intake and generalized weakness.  She was found to have hyponatremia with a serum sodium level of 120.  She was subsequently admitted to the hospitalist service for further evaluation and treatment.  See below for further details  Assessment & Plan: Symptomatic Hyponatremia: likely due to dehydration-in the setting of poor oral intake.  She appears to have chronic hyponatremia at baseline, sodium levels slowly improving with IV fluids.  Recheck electrolytes tomorrow.  Decrease IV fluids to 50 cc an hour, encourage oral intake.    Acute urinary retention: Developed urinary retention last night and again this morning (per RN more than 850 cc on bladder scan this morning)-start Flomax, Place Foley catheter.  Check UA to make sure no UTI.  Discussed with daughter-we will minimize IV fluids and attempt voiding trial prior to discharge.  If she fails a voiding trial-we will replace the Foley catheter-and have her follow-up with urology.  HFpEF: Compensated.  Not on scheduled diuretics on the outpatient setting-per family they only give diuretics if she has significant weight gain.. Last echo in July 2017 showed EF 60-65.  Afib: Stable-rate controlled with digoxin and cardizem, eliquis.  Will check dig levels with a.m. labs.  Acute bronchitis: history of sick family contacts. Patient reported coughing, congestion, sputum production (yellow/brown). No fever.  Feels better today.  Lungs are clear on exam.  Continue azythromycin, decongestants, nebulizer as needed  DVT prophylaxis: SCD/eliquis Code Status: FULL Family Communication:daughter at bedside Disposition  Plan:home  Consultants:   none  Procedures:   Bladder scan  Antimicrobials:  azithromycin  Subjective: Patient appears tired with generalized weakness. She is hard of hearing, but able to understand clearly and answer questions.   Objective: Vitals:   01/05/18 1254 01/05/18 1655 01/05/18 2024 01/06/18 0640  BP: (!) 154/71 (!) 165/64 (!) 135/57 (!) 151/60  Pulse:  69 63 67  Resp:  18 18 18   Temp:  98.4 F (36.9 C) 98 F (36.7 C) 98.4 F (36.9 C)  TempSrc:  Oral Oral Oral  SpO2:  96% 94% 96%  Weight:  61.9 kg (136 lb 7.4 oz)  64.3 kg (141 lb 11.2 oz)  Height:  5\' 5"  (1.651 m)      Intake/Output Summary (Last 24 hours) at 01/06/2018 1020 Last data filed at 01/05/2018 1724 Gross per 24 hour  Intake 4.17 ml  Output 850 ml  Net -845.83 ml   Filed Weights   01/05/18 0240 01/05/18 1655 01/06/18 0640  Weight: 59 kg (130 lb) 61.9 kg (136 lb 7.4 oz) 64.3 kg (141 lb 11.2 oz)    Examination:  Constitutional: generalized fatigue/weakness Eyes: PERRL, lids and conjunctivae normal ENMT: Mucous membranes are moist. No oropharyngeal exudates Neck: normal, supple, no LAD Respiratory: clear to auscultation bilaterally, no wheezing, no crackles. Normal respiratory effort. No accessory muscle use.  Cardiovascular: Regular rate and rhythm, no murmurs / rubs / gallops. 1+ Leg edema. 2+ pedal pulses. Abdomen: hard lower quadrants  Musculoskeletal:No joint deformity upper and lower extremities. No contractures. Normal muscle tone. Generalized leg pain, worse on palpation Skin:  Fragile. no rashes, lesions, ulcers. Diffuse ecchymosis. Peripheral insufficiency Neurologic: CN 2-12 grossly intact. Strength 5/5 in all 4.  Psychiatric: Normal judgment  and insight. Alert and oriented x 3. Normal mood.    Data Reviewed: I have independently reviewed following labs and imaging studies   CBC: Recent Labs  Lab 01/05/18 0427 01/06/18 0535  WBC 7.5 4.0  NEUTROABS 5.2  --   HGB 11.3* 9.0*   HCT 32.9* 26.4*  MCV 88.4 89.2  PLT 159 129*   Basic Metabolic Panel: Recent Labs  Lab 01/05/18 0427 01/05/18 0646 01/05/18 1653 01/05/18 2123 01/06/18 0535  NA 121* 124* 124* 125* 127*  K 4.7 4.2 4.6 5.0 4.5  CL 89* 93* 96* 97* 99*  CO2 24 24 23 23 22   GLUCOSE 113* 140* 118* 136* 102*  BUN 16 15 14 12 10   CREATININE 1.10* 1.04* 0.94 0.97 0.89  CALCIUM 8.9 8.5* 8.4* 8.3* 8.3*   GFR: Estimated Creatinine Clearance: 40.1 mL/min (by C-G formula based on SCr of 0.89 mg/dL). Liver Function Tests: Recent Labs  Lab 01/05/18 0427  AST 22  ALT 15  ALKPHOS 64  BILITOT 0.8  PROT 6.1*  ALBUMIN 3.2*   No results for input(s): LIPASE, AMYLASE in the last 168 hours. No results for input(s): AMMONIA in the last 168 hours. Coagulation Profile: No results for input(s): INR, PROTIME in the last 168 hours. Cardiac Enzymes: No results for input(s): CKTOTAL, CKMB, CKMBINDEX, TROPONINI in the last 168 hours. BNP (last 3 results) No results for input(s): PROBNP in the last 8760 hours. HbA1C: No results for input(s): HGBA1C in the last 72 hours. CBG: No results for input(s): GLUCAP in the last 168 hours. Lipid Profile: No results for input(s): CHOL, HDL, LDLCALC, TRIG, CHOLHDL, LDLDIRECT in the last 72 hours. Thyroid Function Tests: No results for input(s): TSH, T4TOTAL, FREET4, T3FREE, THYROIDAB in the last 72 hours. Anemia Panel: No results for input(s): VITAMINB12, FOLATE, FERRITIN, TIBC, IRON, RETICCTPCT in the last 72 hours. Urine analysis:    Component Value Date/Time   COLORURINE YELLOW 10/27/2017 1918   APPEARANCEUR CLEAR 10/27/2017 1918   LABSPEC 1.016 10/27/2017 1918   PHURINE 5.0 10/27/2017 1918   GLUCOSEU NEGATIVE 10/27/2017 1918   HGBUR NEGATIVE 10/27/2017 1918   BILIRUBINUR NEGATIVE 10/27/2017 1918   KETONESUR NEGATIVE 10/27/2017 1918   PROTEINUR NEGATIVE 10/27/2017 1918   NITRITE NEGATIVE 10/27/2017 1918   LEUKOCYTESUR NEGATIVE 10/27/2017 1918   Sepsis  Labs: Invalid input(s): PROCALCITONIN, LACTICIDVEN  No results found for this or any previous visit (from the past 240 hour(s)).    Radiology Studies: Dg Chest 2 View  Result Date: 01/04/2018 CLINICAL DATA:  Productive cough for 10 days EXAM: CHEST  2 VIEW COMPARISON:  11/09/2017 FINDINGS: There is no focal consolidation. There is mild left lower lobe atelectasis. There is no pleural effusion or pneumothorax. There is stable cardiomegaly. There is a small hiatal hernia. The osseous structures are unremarkable. IMPRESSION: No active cardiopulmonary disease. Electronically Signed   By: Elige Ko   On: 01/04/2018 19:23   Dg Chest Port 1 View  Result Date: 01/05/2018 CLINICAL DATA:  Shortness of breath EXAM: PORTABLE CHEST 1 VIEW COMPARISON:  PA and lateral chest x-ray of January 04, 2018 and November 09, 2017. FINDINGS: The lungs are adequately inflated. There is linear increased density in the mid and lower lung zones bilaterally. There is no alveolar infiltrate or pleural effusion. The heart is mildly enlarged. The pulmonary vascularity is not engorged. There calcification in the wall of the aortic arch. The observed bony thorax exhibits no acute abnormality. IMPRESSION: Subsegmental atelectasis bilaterally. No alveolar pneumonia nor pulmonary edema.  Mild cardiac enlargement which is not a new finding. Thoracic aortic atherosclerosis. Electronically Signed   By: David  [place] M.D.   On: 01/05/2018 07:35     Scheduled Meds: . apixaban  2.5 mg Oral BID  . azithromycin  250 mg Oral Daily  . benazepril  20 mg Oral Daily  . cholecalciferol  1,000 Units Oral Daily  . digoxin  0.125 mg Oral QPM  . diltiazem  240 mg Oral Daily  . escitalopram  10 mg Oral Daily  . estradiol  2 mg Oral Daily  . ferrous sulfate  325 mg Oral Daily  . gabapentin  100 mg Oral QHS  . guaiFENesin  600 mg Oral BID  . hydrALAZINE  25 mg Oral TID  . isosorbide mononitrate  30 mg Oral Daily  . levothyroxine  75 mcg Oral  QAC breakfast  . pantoprazole  40 mg Oral Daily  . pravastatin  20 mg Oral QPM  . predniSONE  50 mg Oral Once  . senna  1 tablet Oral BID  . sodium chloride flush  3 mL Intravenous Q12H   Continuous Infusions: . sodium chloride    . sodium chloride 50 mL/hr at 01/06/18 1610       Time spent: 25 min   Cadence Fransico Michael PA-S  Attending MD note  Patient was seen, examined,treatment plan was discussed with the PA-S.  I have personally reviewed the clinical findings, lab, imaging studies and management of this patient in detail. I agree with the documentation, as recorded by the PA-S  Patient is 83 year old female with history of atrial fibrillation on anticoagulation, chronic diastolic heart failure presented with symptomatic hyponatremia.  On Exam: Vitals:   01/05/18 1254 01/05/18 1655 01/05/18 2024 01/06/18 0640  BP: (!) 154/71 (!) 165/64 (!) 135/57 (!) 151/60  Pulse:  69 63 67  Resp:  18 18 18   Temp:  98.4 F (36.9 C) 98 F (36.7 C) 98.4 F (36.9 C)  TempSrc:  Oral Oral Oral  SpO2:  96% 94% 96%  Weight:  61.9 kg (136 lb 7.4 oz)  64.3 kg (141 lb 11.2 oz)  Height:  5\' 5"  (1.651 m)     Gen. exam: Awake, alert, not in any distress Chest: Good air entry bilaterally, no rhonchi or rales CVS: S1-S2 regular, no murmurs Abdomen: Soft, nontender and nondistended Neurology: Non-focal Skin: No rash or lesions   Lab Data: CBC: Recent Labs  Lab 01/05/18 0427 01/06/18 0535  WBC 7.5 4.0  NEUTROABS 5.2  --   HGB 11.3* 9.0*  HCT 32.9* 26.4*  MCV 88.4 89.2  PLT 159 129*    Basic Metabolic Panel: Recent Labs  Lab 01/05/18 0646 01/05/18 1653 01/05/18 2123 01/06/18 0535 01/06/18 1314  NA 124* 124* 125* 127* 128*  K 4.2 4.6 5.0 4.5 4.7  CL 93* 96* 97* 99* 102  CO2 24 23 23 22 22   GLUCOSE 140* 118* 136* 102* 100*  BUN 15 14 12 10 10   CREATININE 1.04* 0.94 0.97 0.89 0.92  CALCIUM 8.5* 8.4* 8.3* 8.3* 8.4*    GFR: Estimated Creatinine Clearance: 38.8 mL/min (by C-G  formula based on SCr of 0.92 mg/dL).  Liver Function Tests: Recent Labs  Lab 01/05/18 0427  AST 22  ALT 15  ALKPHOS 64  BILITOT 0.8  PROT 6.1*  ALBUMIN 3.2*   No results for input(s): LIPASE, AMYLASE in the last 168 hours. No results for input(s): AMMONIA in the last 168 hours.  Coagulation Profile: No results for  input(s): INR, PROTIME in the last 168 hours.   Assessment and plan: Symptomatic hyponatremia: Appears secondary to dehydration from very poor oral intake-sodium levels are improving with IV fluids.  Recheck electrolytes in a.m.-encourage oral intake-minimize IV fluids as much as possible.  Acute urinary retention: Developed urinary retention last night (around 800 cc) and again this morning (850 cc on bladder scan).  After discussion with family-have placed a Foley catheter-and have started Flomax.  Family/daughter aware that she may have to be discharged with a Foley catheter to have her follow-up with urology in the outpatient setting if she fails a voiding trial prior to discharge.  Chronic diastolic heart failure: Stable  Paroxysmal atrial fibrillation: Stable-continue Eliquis.  Rest as above  Brewing technologisthanker Ghimire [facility]

## 2018-01-06 NOTE — Progress Notes (Addendum)
CSW consulted indicating daughter has been considering that pt may need to be placed in facility.  PT consult pending. Met with daughter and pt at bedside. Initiated discussion however daughter politely stopped CSW and indicated aside that she is not ready to discuss her thoughts about potential placement in front of pt. CSW informed her that CSW will watch for pt's participation with therapy and follow up with daughter later. Both pt and daughter agreeable.  Sharren Bridge, MSW, LCSW Clinical Social Work 01/06/2018 [phone]  Left voicemail with daughter following up after PT eval this afternoon. HHPT recommended, however daughter advises not interested in therapy following DC.  CSW will sign off, please re-consult if needs arise.

## 2018-01-07 DIAGNOSIS — R05 Cough: Secondary | ICD-10-CM

## 2018-01-07 DIAGNOSIS — E878 Other disorders of electrolyte and fluid balance, not elsewhere classified: Secondary | ICD-10-CM

## 2018-01-07 DIAGNOSIS — R059 Cough, unspecified: Secondary | ICD-10-CM

## 2018-01-07 DIAGNOSIS — R0602 Shortness of breath: Secondary | ICD-10-CM

## 2018-01-07 LAB — BASIC METABOLIC PANEL
Anion gap: 3 — ABNORMAL LOW (ref 5–15)
BUN: 10 mg/dL (ref 6–20)
CHLORIDE: 105 mmol/L (ref 101–111)
CO2: 22 mmol/L (ref 22–32)
CREATININE: 1.02 mg/dL — AB (ref 0.44–1.00)
Calcium: 8.3 mg/dL — ABNORMAL LOW (ref 8.9–10.3)
GFR calc Af Amer: 56 mL/min — ABNORMAL LOW (ref >60)
GFR calc non Af Amer: 48 mL/min — ABNORMAL LOW (ref >60)
GLUCOSE: 93 mg/dL (ref 65–99)
Potassium: 4.4 mmol/L (ref 3.5–5.1)
Sodium: 130 mmol/L — ABNORMAL LOW (ref 135–145)

## 2018-01-07 LAB — DIGOXIN LEVEL: DIGOXIN LVL: 1.4 ng/mL (ref 0.8–2.0)

## 2018-01-07 MED ORDER — ISOSORBIDE MONONITRATE ER 30 MG PO TB24: 30.0000 mg | ORAL_TABLET | Freq: Every day | ORAL | 0 refills | Status: DC

## 2018-01-07 MED ORDER — METHYLPREDNISOLONE 4 MG PO TBPK: ORAL_TABLET | ORAL | 0 refills | Status: DC

## 2018-01-07 MED ORDER — DILTIAZEM HCL ER COATED BEADS 240 MG PO CP24
240.0000 mg | ORAL_CAPSULE | Freq: Every day | ORAL | Status: DC
  Administered 2018-01-07: 240 mg via ORAL
  Filled 2018-01-07: qty 1

## 2018-01-07 MED ORDER — AZITHROMYCIN 250 MG PO TABS: 250.0000 mg | ORAL_TABLET | Freq: Every day | ORAL | 0 refills | Status: DC

## 2018-01-07 MED ORDER — TAMSULOSIN HCL 0.4 MG PO CAPS: 0.4000 mg | ORAL_CAPSULE | Freq: Every day | ORAL | 0 refills | Status: DC

## 2018-01-07 NOTE — Discharge Instructions (Signed)
Follow with Primary MD in5- 7 days   Get CBC, CMP, checked  by Primary MD or SNF MD in 5-7 days   Activity: As tolerated with Full fall precautions use walker/cane & assistance as needed  Disposition Home    Diet:    Heart Healthy   For Heart failure patients - Check your Weight same time everyday, if you gain over 2 pounds, or you develop in leg swelling, experience more shortness of breath or chest pain, call your Primary MD immediately. Follow Cardiac Low Salt Diet and 1.5 lit/day fluid restriction.  On your next visit with your primary care physician please Get Medicines reviewed and adjusted.  Please request your Prim.MD to go over all Hospital Tests and Procedure/Radiological results at the follow up, please get all Hospital records sent to your Prim MD by signing hospital release before you go home.

## 2018-01-07 NOTE — Discharge Summary (Signed)
Lisa LodgeJuanita Griffin UJW:[id number] DOB: [birth year] DOA: 01/05/2018  PCP: Patient, No Pcp Per  Admit date: 01/05/2018  Discharge date: 01/07/2018  Admitted From: Home   Disposition:  Home   Recommendations for Outpatient Follow-up:   Follow up with PCP in 1-2 weeks  PCP Please obtain BMP/CBC, 2 view CXR in 1week,  (see Discharge instructions)   PCP Please follow up on the following pending results: None   Home Health: HHPT, RN   Equipment/Devices: Walker  Consultations: None Discharge Condition: Stable   CODE STATUS: Full   Diet Recommendation:  Heart Healthy    Chief Complaint  Patient presents with  . Shortness of Breath     Brief history of present illness from the day of admission and additional interim summary    84 year old female with history of chronic diastolic heart failure, atrial fibrillation on anticoagulation, chronic hyponatremia who presented to the hospital with poor oral intake and generalized weakness.  She was found to have hyponatremia with a serum sodium level of 120.  She was subsequently admitted to the hospitalist service for further evaluation and treatment.  See below for further details                                                                  Hospital Course    Symptomatic Hyponatremia: likely due to dehydration-in the setting of poor oral intake.  She appears to have chronic hyponatremia at baseline, sodium levels considerably improved after hydration, feels better daughter wants to take her home, encouraged to keep an eye on her oral intake, PCP to monitor BMP, continues to have generalized weakness and deconditioning for which home health PT, RN and a rolling walker have been prescribed as well.    Acute urinary retention: Developed urinary retention this admission, placed on  Foley and Flomax, trial of Foley removal will be done this morning, discharged on Flomax.  If fails Foley removal trial she will get Foley replaced and will go home with it, along with outpatient urology follow-up,.  HFpEF last EF 60%: Compensated.  PCP to monitor weight in the outpatient setting no acute issues with heart failure this admission.  Chronic atrial fibrillation with [place] vas 2 score of at least 4: Stable-rate controlled with digoxin and cardizem, eliquis.  Acute bronchitis: Improved on azithromycin and trial of steroids, continue both upon discharge orally with a rapid steroid taper and 3 days of oral azithromycin.  Hypertension.  Stable on current regimen.  Dyslipidemia.  On home dose statin.  Discharge diagnosis     Active Problems:   Hyponatremia   Shortness of breath   Hypochloremia   Cough    Discharge instructions    Discharge Instructions    Diet - low sodium heart healthy   Complete by:  As  directed    Discharge instructions   Complete by:  As directed    Follow with Primary MD in5- 7 days   Get CBC, CMP, checked  by Primary MD or SNF MD in 5-7 days   Activity: As tolerated with Full fall precautions use walker/cane & assistance as needed  Disposition Home    Diet:    Heart Healthy   For Heart failure patients - Check your Weight same time everyday, if you gain over 2 pounds, or you develop in leg swelling, experience more shortness of breath or chest pain, call your Primary MD immediately. Follow Cardiac Low Salt Diet and 1.5 lit/day fluid restriction.  On your next visit with your primary care physician please Get Medicines reviewed and adjusted.  Please request your Prim.MD to go over all Hospital Tests and Procedure/Radiological results at the follow up, please get all Hospital records sent to your Prim MD by signing hospital release before you go home.   Increase activity slowly   Complete by:  As directed       Discharge Medications    Allergies as of 01/07/2018      Reactions   Metoclopramide Hives   Promethazine Hives   Codeine Nausea Only      Medication List    STOP taking these medications   amLODipine 10 MG tablet Commonly known as:  NORVASC   amoxicillin 500 MG capsule Commonly known as:  AMOXIL     TAKE these medications   azithromycin 250 MG tablet Commonly known as:  ZITHROMAX Take 1 tablet (250 mg total) by mouth daily. What changed:  additional instructions   benazepril 20 MG tablet Commonly known as:  LOTENSIN Take 20 mg daily by mouth.   carbonyl iron 45 MG Tabs tablet Commonly known as:  FEOSOL Take by mouth.   chlordiazePOXIDE-amitriptyline 10-25 MG Tabs tablet Commonly known as:  LIMBITROL DS Take 1 tablet every evening by mouth.   digoxin 0.125 MG tablet Commonly known as:  LANOXIN Take 0.125 mg every evening by mouth.   diltiazem 240 MG 24 hr capsule Commonly known as:  TIAZAC Take 240 mg daily by mouth.   ELIQUIS 2.5 MG Tabs tablet Generic drug:  apixaban Take 2.5 mg 2 (two) times daily by mouth.   escitalopram 10 MG tablet Commonly known as:  LEXAPRO Take 10 mg daily by mouth.   estradiol 2 MG tablet Commonly known as:  ESTRACE Take 2 mg daily by mouth.   gabapentin 100 MG capsule Commonly known as:  NEURONTIN Take 100 mg at bedtime by mouth.   HYDROcodone-acetaminophen 10-325 MG tablet Commonly known as:  NORCO Take 1 tablet every 6 (six) hours as needed by mouth for moderate pain or severe pain.   isosorbide mononitrate 30 MG 24 hr tablet Commonly known as:  IMDUR Take 1 tablet (30 mg total) by mouth daily. Start taking on:  01/08/2018   levothyroxine 75 MCG tablet Commonly known as:  SYNTHROID, LEVOTHROID Take 75 mcg daily before breakfast by mouth.   methylPREDNISolone 4 MG Tbpk tablet Commonly known as:  MEDROL DOSEPAK follow package directions   omeprazole 40 MG capsule Commonly known as:  PRILOSEC Take 40 mg 2 (two) times daily by mouth.    ondansetron 4 MG disintegrating tablet Commonly known as:  ZOFRAN-ODT Take 4 mg every 8 (eight) hours as needed by mouth for nausea or vomiting.   polyethylene glycol packet Commonly known as:  MIRALAX / GLYCOLAX Take 17 g daily as needed  by mouth for moderate constipation.   pravastatin 20 MG tablet Commonly known as:  PRAVACHOL Take 20 mg every evening by mouth.   tamsulosin 0.4 MG Caps capsule Commonly known as:  FLOMAX Take 1 capsule (0.4 mg total) by mouth daily. Start taking on:  01/08/2018   Vitamin D3 1000 units Caps Take 1 capsule daily by mouth.       Follow-up Information    [facility] - Dme Follow up.   Why:  for [facility] Contact information: [address] [place] [address] [phone]        PCP. Schedule an appointment as soon as possible for a visit in 5 day(s).           Major procedures and Radiology Reports - PLEASE review detailed and final reports thoroughly  -         Dg Chest 2 View  Result Date: 01/04/2018 CLINICAL DATA:  Productive cough for 10 days EXAM: CHEST  2 VIEW COMPARISON:  11/09/2017 FINDINGS: There is no focal consolidation. There is mild left lower lobe atelectasis. There is no pleural effusion or pneumothorax. There is stable cardiomegaly. There is a small hiatal hernia. The osseous structures are unremarkable. IMPRESSION: No active cardiopulmonary disease. Electronically Signed   By: Elige Ko   On: 01/04/2018 19:23   Dg Chest Port 1 View  Result Date: 01/05/2018 CLINICAL DATA:  Shortness of breath EXAM: PORTABLE CHEST 1 VIEW COMPARISON:  PA and lateral chest x-ray of January 04, 2018 and November 09, 2017. FINDINGS: The lungs are adequately inflated. There is linear increased density in the mid and lower lung zones bilaterally. There is no alveolar infiltrate or pleural effusion. The heart is mildly enlarged. The pulmonary vascularity is not engorged. There calcification in the wall of the aortic arch.  The observed bony thorax exhibits no acute abnormality. IMPRESSION: Subsegmental atelectasis bilaterally. No alveolar pneumonia nor pulmonary edema. Mild cardiac enlargement which is not a new finding. Thoracic aortic atherosclerosis. Electronically Signed   By: David  [place] M.D.   On: 01/05/2018 07:35    Micro Results     No results found for this or any previous visit (from the past 240 hour(s)).  Today   Subjective    Lisa Griffin today has no headache,no chest abdominal pain,no new weakness tingling or numbness, feels much better wants to go home today.     Objective   Blood pressure (!) 147/57, pulse 65, temperature 98.1 F (36.7 C), temperature source Oral, resp. rate 18, height 5\' 5"  (1.651 m), weight 64.1 kg (141 lb 5 oz), SpO2 96 %.   Intake/Output Summary (Last 24 hours) at 01/07/2018 1018 Last data filed at 01/07/2018 1008 Gross per 24 hour  Intake 3065 ml  Output 2750 ml  Net 315 ml    Exam  Awake  No new F.N deficits, Normal affect [place].AT,PERRAL Supple Neck,No JVD, No cervical lymphadenopathy appriciated.  Symmetrical Chest wall movement, Good air movement bilaterally, CTAB RRR,No Gallops,Rubs or new Murmurs, No Parasternal Heave +ve B.Sounds, Abd Soft, Non tender, No organomegaly appriciated, No rebound -guarding or rigidity. No Cyanosis, Clubbing or edema, frail thin skin   Data Review   CBC w Diff:  Lab Results  Component Value Date   WBC 4.0 01/06/2018   HGB 9.0 (L) 01/06/2018   HCT 26.4 (L) 01/06/2018   PLT 129 (L) 01/06/2018   LYMPHOPCT 24 01/05/2018   MONOPCT 6 01/05/2018   EOSPCT 1 01/05/2018   BASOPCT 0  01/05/2018    CMP:  Lab Results  Component Value Date   NA 130 (L) 01/07/2018   K 4.4 01/07/2018   CL 105 01/07/2018   CO2 22 01/07/2018   BUN 10 01/07/2018   CREATININE 1.02 (H) 01/07/2018   PROT 6.1 (L) 01/05/2018   ALBUMIN 3.2 (L) 01/05/2018   BILITOT 0.8 01/05/2018   ALKPHOS 64 01/05/2018   AST 22 01/05/2018   ALT 15 01/05/2018   .   Total Time in preparing paper work, data evaluation and todays exam - 35 minutes  Susa Raring M.D on 01/07/2018 at 10:18 AM  [facility]   Office  [phone]

## 2018-01-07 NOTE — Progress Notes (Signed)
Reviewed discharge information with patient and caregiver. Answered all questions. Patient and caregiver able to teach back medications and reasons to contact MD or 911. Patient verbalizes importance of PCP follow up appointment. 

## 2018-01-07 NOTE — Progress Notes (Signed)
Removed Foley catheter.

## 2018-01-19 ENCOUNTER — Emergency Department (HOSPITAL_COMMUNITY): Payer: Medicare Other

## 2018-01-19 ENCOUNTER — Inpatient Hospital Stay (HOSPITAL_COMMUNITY)
Admission: EM | Admit: 2018-01-19 | Discharge: 2018-01-20 | DRG: 194 | Disposition: A | Discharge status: Home Health | Payer: Medicare Other | Attending: Internal Medicine | Admitting: Internal Medicine

## 2018-01-19 ENCOUNTER — Encounter (HOSPITAL_COMMUNITY): Payer: Self-pay | Admitting: Emergency Medicine

## 2018-01-19 DIAGNOSIS — E039 Hypothyroidism, unspecified: Secondary | ICD-10-CM | POA: Diagnosis present

## 2018-01-19 DIAGNOSIS — N179 Acute kidney failure, unspecified: Secondary | ICD-10-CM | POA: Diagnosis present

## 2018-01-19 DIAGNOSIS — E871 Hypo-osmolality and hyponatremia: Secondary | ICD-10-CM | POA: Diagnosis not present

## 2018-01-19 DIAGNOSIS — Z79899 Other long term (current) drug therapy: Secondary | ICD-10-CM

## 2018-01-19 DIAGNOSIS — F0391 Unspecified dementia with behavioral disturbance: Secondary | ICD-10-CM | POA: Diagnosis present

## 2018-01-19 DIAGNOSIS — Y95 Nosocomial condition: Secondary | ICD-10-CM | POA: Diagnosis not present

## 2018-01-19 DIAGNOSIS — J189 Pneumonia, unspecified organism: Secondary | ICD-10-CM | POA: Diagnosis not present

## 2018-01-19 DIAGNOSIS — K219 Gastro-esophageal reflux disease without esophagitis: Secondary | ICD-10-CM | POA: Diagnosis not present

## 2018-01-19 DIAGNOSIS — Z888 Allergy status to other drugs, medicaments and biological substances status: Secondary | ICD-10-CM

## 2018-01-19 DIAGNOSIS — Z66 Do not resuscitate: Secondary | ICD-10-CM | POA: Diagnosis present

## 2018-01-19 DIAGNOSIS — E785 Hyperlipidemia, unspecified: Secondary | ICD-10-CM | POA: Diagnosis present

## 2018-01-19 DIAGNOSIS — I2510 Atherosclerotic heart disease of native coronary artery without angina pectoris: Secondary | ICD-10-CM | POA: Diagnosis present

## 2018-01-19 DIAGNOSIS — G8929 Other chronic pain: Secondary | ICD-10-CM | POA: Diagnosis present

## 2018-01-19 DIAGNOSIS — N189 Chronic kidney disease, unspecified: Secondary | ICD-10-CM | POA: Diagnosis not present

## 2018-01-19 DIAGNOSIS — R339 Retention of urine, unspecified: Secondary | ICD-10-CM | POA: Diagnosis not present

## 2018-01-19 DIAGNOSIS — Z7901 Long term (current) use of anticoagulants: Secondary | ICD-10-CM

## 2018-01-19 DIAGNOSIS — Z885 Allergy status to narcotic agent status: Secondary | ICD-10-CM | POA: Diagnosis not present

## 2018-01-19 DIAGNOSIS — I110 Hypertensive heart disease with heart failure: Secondary | ICD-10-CM | POA: Diagnosis not present

## 2018-01-19 DIAGNOSIS — Z79890 Hormone replacement therapy (postmenopausal): Secondary | ICD-10-CM | POA: Diagnosis not present

## 2018-01-19 DIAGNOSIS — I5032 Chronic diastolic (congestive) heart failure: Secondary | ICD-10-CM | POA: Diagnosis not present

## 2018-01-19 DIAGNOSIS — I4891 Unspecified atrial fibrillation: Secondary | ICD-10-CM | POA: Diagnosis not present

## 2018-01-19 LAB — URINALYSIS, ROUTINE W REFLEX MICROSCOPIC
Bilirubin Urine: NEGATIVE
Glucose, UA: NEGATIVE mg/dL
Hgb urine dipstick: NEGATIVE
Ketones, ur: NEGATIVE mg/dL
LEUKOCYTES UA: NEGATIVE
NITRITE: NEGATIVE
Protein, ur: NEGATIVE mg/dL
Specific Gravity, Urine: 1.013 (ref 1.005–1.030)
pH: 6 (ref 5.0–8.0)

## 2018-01-19 LAB — COMPREHENSIVE METABOLIC PANEL
ALT: 19 U/L (ref 14–54)
AST: 32 U/L (ref 15–41)
Albumin: 3 g/dL — ABNORMAL LOW (ref 3.5–5.0)
Alkaline Phosphatase: 56 U/L (ref 38–126)
Anion gap: 10 (ref 5–15)
BUN: 18 mg/dL (ref 6–20)
CHLORIDE: 100 mmol/L — AB (ref 101–111)
CO2: 23 mmol/L (ref 22–32)
Calcium: 9 mg/dL (ref 8.9–10.3)
Creatinine, Ser: 1.6 mg/dL — ABNORMAL HIGH (ref 0.44–1.00)
GFR, EST AFRICAN AMERICAN: 32 mL/min — AB (ref >60)
GFR, EST NON AFRICAN AMERICAN: 28 mL/min — AB (ref >60)
Glucose, Bld: 120 mg/dL — ABNORMAL HIGH (ref 65–99)
POTASSIUM: 4.8 mmol/L (ref 3.5–5.1)
Sodium: 133 mmol/L — ABNORMAL LOW (ref 135–145)
Total Bilirubin: 1 mg/dL (ref 0.3–1.2)
Total Protein: 5.6 g/dL — ABNORMAL LOW (ref 6.5–8.1)

## 2018-01-19 LAB — CBC WITH DIFFERENTIAL/PLATELET
BASOS PCT: 0 %
Basophils Absolute: 0 10*3/uL (ref 0.0–0.1)
EOS ABS: 0.1 10*3/uL (ref 0.0–0.7)
Eosinophils Relative: 1 %
HCT: 33.7 % — ABNORMAL LOW (ref 36.0–46.0)
HEMOGLOBIN: 10.8 g/dL — AB (ref 12.0–15.0)
LYMPHS ABS: 2.1 10*3/uL (ref 0.7–4.0)
Lymphocytes Relative: 16 %
MCH: 29.9 pg (ref 26.0–34.0)
MCHC: 32 g/dL (ref 30.0–36.0)
MCV: 93.4 fL (ref 78.0–100.0)
Monocytes Absolute: 0.8 10*3/uL (ref 0.1–1.0)
Monocytes Relative: 6 %
NEUTROS PCT: 77 %
Neutro Abs: 10.4 10*3/uL — ABNORMAL HIGH (ref 1.7–7.7)
Platelets: 221 10*3/uL (ref 150–400)
RBC: 3.61 MIL/uL — AB (ref 3.87–5.11)
RDW: 15.7 % — ABNORMAL HIGH (ref 11.5–15.5)
WBC: 13.4 10*3/uL — AB (ref 4.0–10.5)

## 2018-01-19 LAB — INFLUENZA PANEL BY PCR (TYPE A & B)
INFLBPCR: NEGATIVE
Influenza A By PCR: NEGATIVE

## 2018-01-19 LAB — I-STAT VENOUS BLOOD GAS, ED
Acid-Base Excess: 4 mmol/L — ABNORMAL HIGH (ref 0.0–2.0)
BICARBONATE: 29.5 mmol/L — AB (ref 20.0–28.0)
O2 Saturation: 56 %
PCO2 VEN: 46.8 mmHg (ref 44.0–60.0)
PH VEN: 7.407 (ref 7.250–7.430)
PO2 VEN: 30 mmHg — AB (ref 32.0–45.0)
TCO2: 31 mmol/L (ref 22–32)

## 2018-01-19 LAB — CBG MONITORING, ED: Glucose-Capillary: 117 mg/dL — ABNORMAL HIGH (ref 65–99)

## 2018-01-19 LAB — I-STAT CG4 LACTIC ACID, ED: LACTIC ACID, VENOUS: 1.03 mmol/L (ref 0.5–1.9)

## 2018-01-19 MED ORDER — ESCITALOPRAM OXALATE 10 MG PO TABS
10.0000 mg | ORAL_TABLET | Freq: Every day | ORAL | Status: DC
  Administered 2018-01-20: 10 mg via ORAL
  Filled 2018-01-19: qty 1

## 2018-01-19 MED ORDER — SODIUM CHLORIDE 0.9% FLUSH: 3.0000 mL | INTRAVENOUS | Status: DC | PRN

## 2018-01-19 MED ORDER — PRAVASTATIN SODIUM 20 MG PO TABS
20.0000 mg | ORAL_TABLET | Freq: Every evening | ORAL | Status: DC
  Filled 2018-01-19: qty 1

## 2018-01-19 MED ORDER — DEXTROSE 5 % IV SOLN
1.0000 g | INTRAVENOUS | Status: DC
  Filled 2018-01-19: qty 1

## 2018-01-19 MED ORDER — LEVOTHYROXINE SODIUM 75 MCG PO TABS
75.0000 ug | ORAL_TABLET | Freq: Every day | ORAL | Status: DC
  Administered 2018-01-20: 75 ug via ORAL
  Filled 2018-01-19 (×2): qty 1

## 2018-01-19 MED ORDER — TAMSULOSIN HCL 0.4 MG PO CAPS
0.4000 mg | ORAL_CAPSULE | Freq: Every day | ORAL | Status: DC
  Administered 2018-01-20: 0.4 mg via ORAL
  Filled 2018-01-19: qty 1

## 2018-01-19 MED ORDER — PANTOPRAZOLE SODIUM 40 MG PO TBEC
40.0000 mg | DELAYED_RELEASE_TABLET | Freq: Every day | ORAL | Status: DC
  Administered 2018-01-20: 40 mg via ORAL
  Filled 2018-01-19: qty 1

## 2018-01-19 MED ORDER — SODIUM CHLORIDE 0.9 % IV SOLN
1000.0000 mL | INTRAVENOUS | Status: DC
  Administered 2018-01-19: 1000 mL via INTRAVENOUS

## 2018-01-19 MED ORDER — POLYETHYLENE GLYCOL 3350 17 G PO PACK: 17.0000 g | PACK | Freq: Every day | ORAL | Status: DC | PRN

## 2018-01-19 MED ORDER — DEXTROSE 5 % IV SOLN: 1.0000 g | Freq: Once | INTRAVENOUS | Status: DC

## 2018-01-19 MED ORDER — ONDANSETRON HCL 4 MG PO TABS: 4.0000 mg | ORAL_TABLET | Freq: Four times a day (QID) | ORAL | Status: DC | PRN

## 2018-01-19 MED ORDER — CHLORDIAZEPOXIDE-AMITRIPTYLINE 10-25 MG PO TABS: 1.0000 | ORAL_TABLET | Freq: Every evening | ORAL | Status: DC

## 2018-01-19 MED ORDER — DILTIAZEM HCL ER COATED BEADS 240 MG PO CP24
240.0000 mg | ORAL_CAPSULE | Freq: Every day | ORAL | Status: DC
  Administered 2018-01-20: 240 mg via ORAL
  Filled 2018-01-19 (×2): qty 1

## 2018-01-19 MED ORDER — ACETAMINOPHEN 650 MG RE SUPP
650.0000 mg | Freq: Once | RECTAL | Status: AC
  Administered 2018-01-19: 650 mg via RECTAL
  Filled 2018-01-19: qty 1

## 2018-01-19 MED ORDER — VANCOMYCIN HCL IN DEXTROSE 750-5 MG/150ML-% IV SOLN
750.0000 mg | INTRAVENOUS | Status: DC
  Filled 2018-01-19: qty 150

## 2018-01-19 MED ORDER — BENAZEPRIL HCL 20 MG PO TABS
20.0000 mg | ORAL_TABLET | Freq: Every day | ORAL | Status: DC
  Administered 2018-01-20: 20 mg via ORAL
  Filled 2018-01-19: qty 1

## 2018-01-19 MED ORDER — GABAPENTIN 100 MG PO CAPS
100.0000 mg | ORAL_CAPSULE | Freq: Every day | ORAL | Status: DC
  Filled 2018-01-19: qty 1

## 2018-01-19 MED ORDER — DIGOXIN 125 MCG PO TABS
0.1250 mg | ORAL_TABLET | Freq: Every evening | ORAL | Status: DC
  Filled 2018-01-19: qty 1

## 2018-01-19 MED ORDER — APIXABAN 2.5 MG PO TABS
2.5000 mg | ORAL_TABLET | Freq: Two times a day (BID) | ORAL | Status: DC
  Administered 2018-01-20: 2.5 mg via ORAL
  Filled 2018-01-19 (×3): qty 1

## 2018-01-19 MED ORDER — ZOLPIDEM TARTRATE 5 MG PO TABS: 5.0000 mg | ORAL_TABLET | Freq: Every evening | ORAL | Status: DC | PRN

## 2018-01-19 MED ORDER — ALBUTEROL SULFATE (2.5 MG/3ML) 0.083% IN NEBU: 2.5000 mg | INHALATION_SOLUTION | Freq: Four times a day (QID) | RESPIRATORY_TRACT | Status: DC

## 2018-01-19 MED ORDER — SODIUM CHLORIDE 0.9% FLUSH
3.0000 mL | Freq: Two times a day (BID) | INTRAVENOUS | Status: DC
  Administered 2018-01-20: 3 mL via INTRAVENOUS

## 2018-01-19 MED ORDER — SODIUM CHLORIDE 0.9 % IV BOLUS (SEPSIS)
1000.0000 mL | Freq: Once | INTRAVENOUS | Status: AC
  Administered 2018-01-19: 1000 mL via INTRAVENOUS

## 2018-01-19 MED ORDER — VANCOMYCIN HCL IN DEXTROSE 1-5 GM/200ML-% IV SOLN
1000.0000 mg | Freq: Once | INTRAVENOUS | Status: AC
  Administered 2018-01-19: 1000 mg via INTRAVENOUS

## 2018-01-19 MED ORDER — DEXTROSE 5 % IV SOLN
2.0000 g | Freq: Once | INTRAVENOUS | Status: AC
  Administered 2018-01-19: 2 g via INTRAVENOUS
  Filled 2018-01-19: qty 2

## 2018-01-19 MED ORDER — ISOSORBIDE MONONITRATE ER 30 MG PO TB24
30.0000 mg | ORAL_TABLET | Freq: Every day | ORAL | Status: DC
  Administered 2018-01-20: 30 mg via ORAL
  Filled 2018-01-19: qty 1

## 2018-01-19 MED ORDER — ESTRADIOL 2 MG PO TABS
2.0000 mg | ORAL_TABLET | Freq: Every day | ORAL | Status: DC
  Administered 2018-01-20: 2 mg via ORAL
  Filled 2018-01-19: qty 1

## 2018-01-19 MED ORDER — ONDANSETRON HCL 4 MG/2ML IJ SOLN: 4.0000 mg | Freq: Four times a day (QID) | INTRAMUSCULAR | Status: DC | PRN

## 2018-01-19 MED ORDER — SODIUM CHLORIDE 0.9 % IV SOLN: 250.0000 mL | INTRAVENOUS | Status: DC | PRN

## 2018-01-19 MED ORDER — DEXTROSE 5 % IV SOLN: 500.0000 mg | Freq: Once | INTRAVENOUS | Status: DC

## 2018-01-19 NOTE — ED Notes (Signed)
CBG 117 

## 2018-01-19 NOTE — ED Notes (Signed)
Repeat istat lactic acid discontinued due to first lactic WNL, ok by Dr.Plunkett

## 2018-01-19 NOTE — ED Notes (Signed)
Pt crying out "please let me out of here" Family at bedside states she does this every time she is admitted to the hospital

## 2018-01-19 NOTE — H&P (Signed)
[facility]                                                                                    Patient Demographics  Lisa DupontJuanita Griffin, is a 84 y.o. female  CSN: [id number]  MRN: [id number]  DOB - June 27, 1928  Admit Date - 01/19/2018  Outpatient Primary MD for the patient is Patient, No Pcp Per   With History of -  Past Medical History:  Diagnosis Date  . CHF (congestive heart failure) (HCC)   . LBBB (left bundle branch block)   . Rapid atrial fibrillation (HCC)   . Renal disorder   . Thyroid disease       History reviewed. No pertinent surgical history.  in for   Chief Complaint  Patient presents with  . Fever  . Fatigue     HPI  Lisa LodgeJuanita Griffin  is a 84 y.o. female, with past medical history significant for diastolic congestive heart failure, hyponatremia, urinary retention who was admitted earlier this month for treatment of hyponatremia, brought to the emergency room today for evaluation of fever, excessive sleepiness, congestion and cough.  No history of nausea vomiting or diarrhea noted.  Patient has been having associated anorexia and generalized weakness.    Review of Systems    Unable to obtain   Social History Social History   Tobacco Use  . Smoking status: Never Smoker  . Smokeless tobacco: Never Used  Substance Use Topics  . Alcohol use: No    Frequency: Never     Family History Unable to obtain  Prior to Admission medications   Medication Sig Start Date End Date Taking? Authorizing Provider  azithromycin (ZITHROMAX) 250 MG tablet Take 1 tablet (250 mg total) by mouth daily. 01/07/18   Leroy SeaSingh, Prashant K, MD  benazepril (LOTENSIN) 20 MG tablet Take 20 mg daily by mouth.  07/26/17   [provider]  carbonyl iron (FEOSOL) 45 MG TABS tablet Take by mouth.    [provider]  chlordiazePOXIDE-amitriptyline (LIMBITROL DS) 10-25 MG TABS tablet Take 1 tablet every evening by mouth.    [provider]  Cholecalciferol  (VITAMIN D3) 1000 units CAPS Take 1 capsule daily by mouth.     [provider]  digoxin (LANOXIN) 0.125 MG tablet Take 0.125 mg every evening by mouth.  10/14/17   [provider]  diltiazem (TIAZAC) 240 MG 24 hr capsule Take 240 mg daily by mouth.  10/14/17   [provider]  ELIQUIS 2.5 MG TABS tablet Take 2.5 mg 2 (two) times daily by mouth.  10/14/17   [provider]  escitalopram (LEXAPRO) 10 MG tablet Take 10 mg daily by mouth.  09/20/17   [provider]  estradiol (ESTRACE) 2 MG tablet Take 2 mg daily by mouth.  07/31/17   [provider]  gabapentin (NEURONTIN) 100 MG capsule Take 100 mg at bedtime by mouth.  07/26/17   [provider]  HYDROcodone-acetaminophen (NORCO) 10-325 MG tablet Take 1 tablet every 6 (six) hours as needed by mouth for moderate pain or severe pain.  09/26/17   [provider]  isosorbide mononitrate (IMDUR) 30 MG 24 hr  tablet Take 1 tablet (30 mg total) by mouth daily. 01/08/18   Leroy Sea, MD  levothyroxine (SYNTHROID, LEVOTHROID) 75 MCG tablet Take 75 mcg daily before breakfast by mouth.    [provider]  methylPREDNISolone (MEDROL DOSEPAK) 4 MG TBPK tablet follow package directions 01/07/18   Leroy Sea, MD  omeprazole (PRILOSEC) 40 MG capsule Take 40 mg 2 (two) times daily by mouth.    [provider]  ondansetron (ZOFRAN-ODT) 4 MG disintegrating tablet Take 4 mg every 8 (eight) hours as needed by mouth for nausea or vomiting.  09/13/17   [provider]  polyethylene glycol (MIRALAX / GLYCOLAX) packet Take 17 g daily as needed by mouth for moderate constipation.     [provider]  pravastatin (PRAVACHOL) 20 MG tablet Take 20 mg every evening by mouth.  08/31/17   [provider]  tamsulosin (FLOMAX) 0.4 MG CAPS capsule Take 1 capsule (0.4 mg total) by mouth daily. 01/08/18   Leroy Sea, MD    Allergies  Allergen Reactions  .  Metoclopramide Hives  . Promethazine Hives  . Codeine Nausea Only    Physical Exam  Vitals  Blood pressure (!) 120/41, pulse 72, resp. rate 19, SpO2 99 %.   1. General elderly female, well-developed, well-nourished  2.  Obtunded.  3.  Obtunded , unable to obtain full neurologic evaluation.  4. Ears and Eyes appear Normal, Conjunctivae clear, PERRLA. Moist Oral Mucosa.  5. Supple Neck, No JVD, , No Carotid Bruits.  6. Symmetrical Chest wall movement, decreased breath sounds at the bases.  7.  Irregularly irregular, No Gallops, Rubs or Murmurs, No Parasternal Heave.  8. Positive Bowel Sounds, Abdomen Soft, Non tender, No organomegaly appriciated,No rebound -guarding or rigidity.  9.  No Cyanosis, Normal Skin Turgor, No Skin Rash or Bruise.  10.  Decreased muscle tone,  joints appear normal , no effusions, Normal ROM.      Data Review  CBC Recent Labs  Lab 01/19/18 1905  WBC 13.4*  HGB 10.8*  HCT 33.7*  PLT 221  MCV 93.4  MCH 29.9  MCHC 32.0  RDW 15.7*  LYMPHSABS 2.1  MONOABS 0.8  EOSABS 0.1  BASOSABS 0.0   ------------------------------------------------------------------------------------------------------------------  Chemistries  Recent Labs  Lab 01/19/18 1905  NA 133*  K 4.8  CL 100*  CO2 23  GLUCOSE 120*  BUN 18  CREATININE 1.60*  CALCIUM 9.0  AST 32  ALT 19  ALKPHOS 56  BILITOT 1.0   ------------------------------------------------------------------------------------------------------------------ estimated creatinine clearance is 22.3 mL/min (A) (by C-G formula based on SCr of 1.6 mg/dL (H)). ------------------------------------------------------------------------------------------------------------------ No results for input(s): TSH, T4TOTAL, T3FREE, THYROIDAB in the last 72 hours.  Invalid input(s): FREET3   Coagulation profile No results for input(s): INR, PROTIME in the last 168  hours. ------------------------------------------------------------------------------------------------------------------- No results for input(s): DDIMER in the last 72 hours. -------------------------------------------------------------------------------------------------------------------  Cardiac Enzymes No results for input(s): CKMB, TROPONINI, MYOGLOBIN in the last 168 hours.  Invalid input(s): CK ------------------------------------------------------------------------------------------------------------------ Invalid input(s): POCBNP   ---------------------------------------------------------------------------------------------------------------  Urinalysis    Component Value Date/Time   COLORURINE YELLOW 01/19/2018 1937   APPEARANCEUR CLEAR 01/19/2018 1937   LABSPEC 1.013 01/19/2018 1937   PHURINE 6.0 01/19/2018 1937   GLUCOSEU NEGATIVE 01/19/2018 1937   HGBUR NEGATIVE 01/19/2018 1937   BILIRUBINUR NEGATIVE 01/19/2018 1937   KETONESUR NEGATIVE 01/19/2018 1937   PROTEINUR NEGATIVE 01/19/2018 1937   NITRITE NEGATIVE 01/19/2018 1937   LEUKOCYTESUR NEGATIVE 01/19/2018 1937    ----------------------------------------------------------------------------------------------------------------  Imaging results:   Dg Chest 2 View  Result Date: 01/04/2018 CLINICAL DATA:  Productive cough for 10 days EXAM: CHEST  2 VIEW COMPARISON:  11/09/2017 FINDINGS: There is no focal consolidation. There is mild left lower lobe atelectasis. There is no pleural effusion or pneumothorax. There is stable cardiomegaly. There is a small hiatal hernia. The osseous structures are unremarkable. IMPRESSION: No active cardiopulmonary disease. Electronically Signed   By: Elige Ko   On: 01/04/2018 19:23   Dg Chest Port 1 View  Result Date: 01/19/2018 CLINICAL DATA:  Patient with lethargy and productive cough. EXAM: PORTABLE CHEST 1 VIEW COMPARISON:  Chest radiograph 01/05/2018 FINDINGS: Multiple  monitoring leads overlie the patient. Stable cardiomegaly. Heterogeneous opacities left lung base. Small left pleural effusion. No pneumothorax. IMPRESSION: Heterogeneous opacities left lung base may represent pneumonia. Probable small left pleural effusion. Followup PA and lateral chest X-ray is recommended in 3-4 weeks following trial of antibiotic therapy to ensure resolution and exclude underlying malignancy. Electronically Signed   By: Annia Belt M.D.   On: 01/19/2018 19:44   Dg Chest Port 1 View  Result Date: 01/05/2018 CLINICAL DATA:  Shortness of breath EXAM: PORTABLE CHEST 1 VIEW COMPARISON:  PA and lateral chest x-ray of January 04, 2018 and November 09, 2017. FINDINGS: The lungs are adequately inflated. There is linear increased density in the mid and lower lung zones bilaterally. There is no alveolar infiltrate or pleural effusion. The heart is mildly enlarged. The pulmonary vascularity is not engorged. There calcification in the wall of the aortic arch. The observed bony thorax exhibits no acute abnormality. IMPRESSION: Subsegmental atelectasis bilaterally. No alveolar pneumonia nor pulmonary edema. Mild cardiac enlargement which is not a new finding. Thoracic aortic atherosclerosis. Electronically Signed   By: David  [place] M.D.   On: 01/05/2018 07:35      Assessment & Plan  1.  Healthcare associated pneumonia 2.  Diastolic congestive heart failure , chronic with no exacerbation 3.  Chronic hyponatremia 4.  Atrial fibrillation, controlled rate on Eliquis 5.  Chronic renal failure  Plan  IV vancomycin and cefepime Nebulizer treatments Goals of care to be discussed  DVT Prophylaxis Lovenox  AM Labs Ordered, also please review Full Orders    Code Status full  Disposition Plan: Undetermined  Time spent in minutes : 46 minutes  Condition GUARDED   @SIGNATURE @

## 2018-01-19 NOTE — ED Triage Notes (Signed)
Pt BIB [facility] from home with increased lethargy, productive cough, and a reported temp of 101.9. EMS reports SpO2 90% on room air, placed on 2L [place]. EMS vitals: BP 114/60, HR 75.

## 2018-01-19 NOTE — ED Provider Notes (Signed)
[facility] EMERGENCY DEPARTMENT Provider Note   CSN: [id number] Arrival date & time: 01/19/18  1846     History   Chief Complaint Chief Complaint  Patient presents with  . Fever  . Fatigue    HPI Ronnett Clontz is a 84 y.o. female.  The history is provided by the patient, the EMS personnel and a relative.  Fever   This is a new problem. Episode onset: 2-3 days. The problem occurs constantly. The problem has been gradually worsening. The maximum temperature noted was 101 to 101.9 F. The temperature was taken using an oral thermometer. Associated symptoms include sleepiness, congestion and cough. Pertinent negatives include no diarrhea and no vomiting. Associated symptoms comments: Anorexia, generalized weakness.  Unable to get out of bed today.  Also productive cough with sputum and pt c/o of some mild SOB.  No chest pain.  Family notes some confusion. She has tried acetaminophen for the symptoms. The treatment provided no relief.    Past Medical History:  Diagnosis Date  . CHF (congestive heart failure) (HCC)   . LBBB (left bundle branch block)   . Rapid atrial fibrillation (HCC)   . Renal disorder   . Thyroid disease     Patient Active Problem List   Diagnosis Date Noted  . Shortness of breath   . Hypochloremia   . Cough   . Hyponatremia 01/05/2018    No past surgical history on file.  OB History    No data available       Home Medications    Prior to Admission medications   Medication Sig Start Date End Date Taking? Authorizing Provider  azithromycin (ZITHROMAX) 250 MG tablet Take 1 tablet (250 mg total) by mouth daily. 01/07/18   Leroy Sea, MD  benazepril (LOTENSIN) 20 MG tablet Take 20 mg daily by mouth.  07/26/17   [provider]  carbonyl iron (FEOSOL) 45 MG TABS tablet Take by mouth.    [provider]  chlordiazePOXIDE-amitriptyline (LIMBITROL DS) 10-25 MG TABS tablet Take 1 tablet every evening by mouth.     [provider]  Cholecalciferol (VITAMIN D3) 1000 units CAPS Take 1 capsule daily by mouth.     [provider]  digoxin (LANOXIN) 0.125 MG tablet Take 0.125 mg every evening by mouth.  10/14/17   [provider]  diltiazem (TIAZAC) 240 MG 24 hr capsule Take 240 mg daily by mouth.  10/14/17   [provider]  ELIQUIS 2.5 MG TABS tablet Take 2.5 mg 2 (two) times daily by mouth.  10/14/17   [provider]  escitalopram (LEXAPRO) 10 MG tablet Take 10 mg daily by mouth.  09/20/17   [provider]  estradiol (ESTRACE) 2 MG tablet Take 2 mg daily by mouth.  07/31/17   [provider]  gabapentin (NEURONTIN) 100 MG capsule Take 100 mg at bedtime by mouth.  07/26/17   [provider]  HYDROcodone-acetaminophen (NORCO) 10-325 MG tablet Take 1 tablet every 6 (six) hours as needed by mouth for moderate pain or severe pain.  09/26/17   [provider]  isosorbide mononitrate (IMDUR) 30 MG 24 hr tablet Take 1 tablet (30 mg total) by mouth daily. 01/08/18   Leroy Sea, MD  levothyroxine (SYNTHROID, LEVOTHROID) 75 MCG tablet Take 75 mcg daily before breakfast by mouth.    [provider]  methylPREDNISolone (MEDROL DOSEPAK) 4 MG TBPK tablet follow package directions 01/07/18   Leroy Sea, MD  omeprazole (PRILOSEC) 40 MG capsule Take 40 mg 2 (two) times daily by mouth.    [provider]  ondansetron (ZOFRAN-ODT) 4 MG disintegrating tablet Take 4 mg every 8 (eight) hours as needed by mouth for nausea or vomiting.  09/13/17   [provider]  polyethylene glycol (MIRALAX / GLYCOLAX) packet Take 17 g daily as needed by mouth for moderate constipation.     [provider]  pravastatin (PRAVACHOL) 20 MG tablet Take 20 mg every evening by mouth.  08/31/17   [provider]  tamsulosin (FLOMAX) 0.4 MG CAPS capsule Take 1 capsule (0.4 mg total) by mouth daily. 01/08/18   Leroy Sea, MD     Family History No family history on file.  Social History Social History   Tobacco Use  . Smoking status: Never Smoker  . Smokeless tobacco: Never Used  Substance Use Topics  . Alcohol use: No    Frequency: Never  . Drug use: No     Allergies   Metoclopramide; Promethazine; and Codeine   Review of Systems Review of Systems  Constitutional: Positive for fever.  HENT: Positive for congestion.   Respiratory: Positive for cough.   Gastrointestinal: Negative for diarrhea and vomiting.  All other systems reviewed and are negative.    Physical Exam Updated Vital Signs BP (!) 120/41 (BP Location: Left Arm)   Pulse 72   Resp 19   SpO2 99%   Physical Exam  Constitutional: She is oriented to person, place, and time. She appears well-developed and well-nourished. No distress.  HENT:  Head: Normocephalic and atraumatic.  Mouth/Throat: Mucous membranes are dry.  Eyes: EOM are normal. Pupils are equal, round, and reactive to light.  Cardiovascular: Normal rate, normal heart sounds and intact distal pulses. An irregularly irregular rhythm present. Exam reveals no friction rub.  No murmur heard. Pulmonary/Chest: Effort normal. She has no wheezes. She has rhonchi in the right upper field and the right middle field. She has no rales.  Abdominal: Soft. Bowel sounds are normal. She exhibits no distension. There is no tenderness. There is no rebound and no guarding.  Musculoskeletal: Normal range of motion. She exhibits no tenderness.  No edema.  Extensive contusions of the lower legs in different stages of healing.  No erythema or induration.  Skin changes consistent with chronic venous stasis  Neurological: She is alert and oriented to person, place, and time. No cranial nerve deficit.  Moving all ext.  Answers a few questions but sleepy.  Skin: Skin is warm and dry. Capillary refill takes 2 to 3 seconds. No rash noted.  Decreased skin turgor   Psychiatric: She has a normal  mood and affect. Her behavior is normal.  Nursing note and vitals reviewed.    ED Treatments / Results  Labs (all labs ordered are listed, but only abnormal results are displayed) Labs Reviewed  COMPREHENSIVE METABOLIC PANEL - Abnormal; Notable for the following components:      Result Value   Sodium 133 (*)    Chloride 100 (*)    Glucose, Bld 120 (*)    Creatinine, Ser 1.60 (*)    Total Protein 5.6 (*)    Albumin 3.0 (*)    GFR calc non Af Amer 28 (*)    GFR calc Af Amer 32 (*)    All other components within normal limits  CBC WITH DIFFERENTIAL/PLATELET - Abnormal; Notable for the following components:   WBC 13.4 (*)    RBC 3.61 (*)  Hemoglobin 10.8 (*)    HCT 33.7 (*)    RDW 15.7 (*)    Neutro Abs 10.4 (*)    All other components within normal limits  I-STAT VENOUS BLOOD GAS, ED - Abnormal; Notable for the following components:   pO2, Ven 30.0 (*)    Bicarbonate 29.5 (*)    Acid-Base Excess 4.0 (*)    All other components within normal limits  CULTURE, BLOOD (ROUTINE X 2)  CULTURE, BLOOD (ROUTINE X 2)  URINE CULTURE  URINALYSIS, ROUTINE W REFLEX MICROSCOPIC  INFLUENZA PANEL BY PCR (TYPE A & B)  I-STAT CG4 LACTIC ACID, ED    EKG  EKG Interpretation  Date/Time:  Thursday January 19 2018 18:59:50 EST Ventricular Rate:  70 PR Interval:    QRS Duration: 130 QT Interval:  411 QTC Calculation: 444 R Axis:   21 Text Interpretation:  Atrial fibrillation Left bundle branch block No significant change since last tracing Confirmed by Gwyneth Sprout ([id number]) on 01/19/2018 7:06:25 PM       Radiology Dg Chest Port 1 View  Result Date: 01/19/2018 CLINICAL DATA:  Patient with lethargy and productive cough. EXAM: PORTABLE CHEST 1 VIEW COMPARISON:  Chest radiograph 01/05/2018 FINDINGS: Multiple monitoring leads overlie the patient. Stable cardiomegaly. Heterogeneous opacities left lung base. Small left pleural effusion. No pneumothorax. IMPRESSION: Heterogeneous opacities  left lung base may represent pneumonia. Probable small left pleural effusion. Followup PA and lateral chest X-ray is recommended in 3-4 weeks following trial of antibiotic therapy to ensure resolution and exclude underlying malignancy. Electronically Signed   By: Annia Belt M.D.   On: 01/19/2018 19:44    Procedures Procedures (including critical care time)  Medications Ordered in ED Medications  0.9 %  sodium chloride infusion (1,000 mLs Intravenous New Bag/Given 01/19/18 1918)  ceFEPIme (MAXIPIME) 2 g in dextrose 5 % 50 mL IVPB (not administered)  vancomycin (VANCOCIN) IVPB 1000 mg/200 mL premix (not administered)  sodium chloride 0.9 % bolus 1,000 mL (1,000 mLs Intravenous New Bag/Given 01/19/18 1917)    And  sodium chloride 0.9 % bolus 1,000 mL (1,000 mLs Intravenous New Bag/Given 01/19/18 1918)  acetaminophen (TYLENOL) suppository 650 mg (650 mg Rectal Given 01/19/18 1917)     Initial Impression / Assessment and Plan / ED Course  I have reviewed the triage vital signs and the nursing notes.  Pertinent labs & imaging results that were available during my care of the patient were reviewed by me and considered in my medical decision making (see chart for details).     Elderly female presenting today as a code sepsis.  Patient has been febrile for the last few days with a productive cough.  She is still able to answer some questions on exam but is lethargic and a dehydrated appearing.  She has some crackles in the right lung but no abdominal pain.  She has no evidence of cellulitis and low suspicion for meningitis.  Code sepsis initiated with 30/kg of IV fluids equaling 2 L.  Patient given Tylenol for fever.  Chest x-ray, urine and blood work are pending.  Patient also had a flu swab sent.  8:27 PM Patient's chest x-ray is consistent with pneumonia, labs with a new AK I with a renal function of 1.6, normal lactate, VBG without acidosis and urine without signs of infection.  Patient was just  hospitalized earlier in January for CHF but also concern for possible pneumonia.  She had already had azithromycin so she will be treated as a  community-acquired pneumonia.  After fluids patient's mental status seems the same and blood pressure remains stable.  Will admit for further care.  Final Clinical Impressions(s) / ED Diagnoses   Final diagnoses:  HAP (hospital-acquired pneumonia)    ED Discharge Orders    None       Gwyneth SproutPlunkett, Valerio Pinard, MD 01/19/18 2029

## 2018-01-19 NOTE — Progress Notes (Addendum)
Pharmacy Antibiotic Note  Lisa DupontJuanita Griffin is a 84 y.o. female admitted on 01/19/2018 with pneumonia.  Pharmacy has been consulted for vancomycin + cefepime dosing. WBC is elevated at 13.4 and SCr is above baseline at 1.6. She was recently discharged on azithromycin. Chest x-ray with possible pneumonia.   Plan: Vancomycin 1gm IV x 1 then 750mg  IV Q24H Cefepime 2gm IV x 1 then 1gm IV Q24H F/u renal fxn, C&S, [facility]    No data recorded.  Recent Labs  Lab 01/19/18 1905 01/19/18 1921  WBC 13.4*  --   CREATININE 1.60*  --   LATICACIDVEN  --  1.03    Estimated Creatinine Clearance: 22.3 mL/min (A) (by C-G formula based on SCr of 1.6 mg/dL (H)).    Allergies  Allergen Reactions  . Metoclopramide Hives  . Promethazine Hives  . Codeine Nausea Only    Antimicrobials this admission: Vanc 1/31>> Cefepime 1/31>>  Dose adjustments this admission: N/A  Microbiology results: Pending  Thank you for allowing pharmacy to be a part of this patient's care.  Francie Keeling, Drake LeachRachel Lynn 01/19/2018 8:34 PM

## 2018-01-20 DIAGNOSIS — K219 Gastro-esophageal reflux disease without esophagitis: Secondary | ICD-10-CM | POA: Diagnosis not present

## 2018-01-20 DIAGNOSIS — E785 Hyperlipidemia, unspecified: Secondary | ICD-10-CM | POA: Diagnosis not present

## 2018-01-20 DIAGNOSIS — Z79890 Hormone replacement therapy (postmenopausal): Secondary | ICD-10-CM | POA: Diagnosis not present

## 2018-01-20 DIAGNOSIS — I110 Hypertensive heart disease with heart failure: Secondary | ICD-10-CM | POA: Diagnosis not present

## 2018-01-20 DIAGNOSIS — J189 Pneumonia, unspecified organism: Principal | ICD-10-CM

## 2018-01-20 DIAGNOSIS — Z885 Allergy status to narcotic agent status: Secondary | ICD-10-CM | POA: Diagnosis not present

## 2018-01-20 DIAGNOSIS — R339 Retention of urine, unspecified: Secondary | ICD-10-CM | POA: Diagnosis not present

## 2018-01-20 DIAGNOSIS — Y95 Nosocomial condition: Secondary | ICD-10-CM | POA: Diagnosis not present

## 2018-01-20 DIAGNOSIS — I5032 Chronic diastolic (congestive) heart failure: Secondary | ICD-10-CM | POA: Diagnosis not present

## 2018-01-20 DIAGNOSIS — Z7901 Long term (current) use of anticoagulants: Secondary | ICD-10-CM | POA: Diagnosis not present

## 2018-01-20 DIAGNOSIS — F0391 Unspecified dementia with behavioral disturbance: Secondary | ICD-10-CM | POA: Diagnosis present

## 2018-01-20 DIAGNOSIS — I4891 Unspecified atrial fibrillation: Secondary | ICD-10-CM | POA: Diagnosis not present

## 2018-01-20 DIAGNOSIS — Z79899 Other long term (current) drug therapy: Secondary | ICD-10-CM | POA: Diagnosis not present

## 2018-01-20 DIAGNOSIS — E039 Hypothyroidism, unspecified: Secondary | ICD-10-CM | POA: Diagnosis not present

## 2018-01-20 DIAGNOSIS — Z66 Do not resuscitate: Secondary | ICD-10-CM | POA: Diagnosis not present

## 2018-01-20 DIAGNOSIS — E871 Hypo-osmolality and hyponatremia: Secondary | ICD-10-CM | POA: Diagnosis not present

## 2018-01-20 DIAGNOSIS — I2510 Atherosclerotic heart disease of native coronary artery without angina pectoris: Secondary | ICD-10-CM | POA: Diagnosis not present

## 2018-01-20 DIAGNOSIS — N179 Acute kidney failure, unspecified: Secondary | ICD-10-CM | POA: Diagnosis not present

## 2018-01-20 DIAGNOSIS — G8929 Other chronic pain: Secondary | ICD-10-CM | POA: Diagnosis not present

## 2018-01-20 LAB — BASIC METABOLIC PANEL
ANION GAP: 8 (ref 5–15)
BUN: 17 mg/dL (ref 6–20)
CHLORIDE: 108 mmol/L (ref 101–111)
CO2: 20 mmol/L — ABNORMAL LOW (ref 22–32)
Calcium: 7.9 mg/dL — ABNORMAL LOW (ref 8.9–10.3)
Creatinine, Ser: 1.28 mg/dL — ABNORMAL HIGH (ref 0.44–1.00)
GFR calc Af Amer: 42 mL/min — ABNORMAL LOW (ref >60)
GFR calc non Af Amer: 36 mL/min — ABNORMAL LOW (ref >60)
Glucose, Bld: 101 mg/dL — ABNORMAL HIGH (ref 65–99)
Potassium: 4.4 mmol/L (ref 3.5–5.1)
SODIUM: 136 mmol/L (ref 135–145)

## 2018-01-20 LAB — CBC
HEMATOCRIT: 28.2 % — AB (ref 36.0–46.0)
HEMOGLOBIN: 9.1 g/dL — AB (ref 12.0–15.0)
MCH: 31.1 pg (ref 26.0–34.0)
MCHC: 32.3 g/dL (ref 30.0–36.0)
MCV: 96.2 fL (ref 78.0–100.0)
Platelets: 167 10*3/uL (ref 150–400)
RBC: 2.93 MIL/uL — AB (ref 3.87–5.11)
RDW: 16.5 % — AB (ref 11.5–15.5)
WBC: 10.7 10*3/uL — AB (ref 4.0–10.5)

## 2018-01-20 LAB — MRSA PCR SCREENING: MRSA BY PCR: POSITIVE — AB

## 2018-01-20 LAB — STREP PNEUMONIAE URINARY ANTIGEN: Strep Pneumo Urinary Antigen: NEGATIVE

## 2018-01-20 MED ORDER — AMITRIPTYLINE HCL 25 MG PO TABS: 25.0000 mg | ORAL_TABLET | Freq: Every day | ORAL | Status: DC

## 2018-01-20 MED ORDER — ORAL CARE MOUTH RINSE
15.0000 mL | Freq: Two times a day (BID) | OROMUCOSAL | Status: DC
  Administered 2018-01-20: 15 mL via OROMUCOSAL

## 2018-01-20 MED ORDER — CEFPODOXIME PROXETIL 100 MG PO TABS: 100.0000 mg | ORAL_TABLET | Freq: Two times a day (BID) | ORAL | 0 refills | Status: AC

## 2018-01-20 MED ORDER — CHLORDIAZEPOXIDE HCL 5 MG PO CAPS: 10.0000 mg | ORAL_CAPSULE | Freq: Every day | ORAL | Status: DC

## 2018-01-20 MED ORDER — HYDROCODONE-ACETAMINOPHEN 10-325 MG PO TABS
1.0000 | ORAL_TABLET | Freq: Four times a day (QID) | ORAL | Status: DC | PRN
  Administered 2018-01-20: 1 via ORAL
  Filled 2018-01-20: qty 1

## 2018-01-20 MED ORDER — ALBUTEROL SULFATE (2.5 MG/3ML) 0.083% IN NEBU
2.5000 mg | INHALATION_SOLUTION | Freq: Three times a day (TID) | RESPIRATORY_TRACT | Status: DC
  Administered 2018-01-20 (×2): 2.5 mg via RESPIRATORY_TRACT
  Filled 2018-01-20 (×2): qty 3

## 2018-01-20 MED ORDER — TAMSULOSIN HCL 0.4 MG PO CAPS: 0.4000 mg | ORAL_CAPSULE | Freq: Every day | ORAL | 0 refills | Status: AC

## 2018-01-20 MED ORDER — MUPIROCIN 2 % EX OINT
TOPICAL_OINTMENT | Freq: Two times a day (BID) | CUTANEOUS | Status: DC
  Filled 2018-01-20: qty 22

## 2018-01-20 NOTE — Care Management Note (Signed)
Case Management Note  Patient Details  Name: Lisa DupontJuanita Bache MRN: [id number] Date of Birth: 23-Jul-1928  Subjective/Objective:   Pt admitted with HCAP. She is from home with her daughter.                  Action/Plan: MD please orders PT/OT eval as needed. Plan currently will be for patient to return home with her daughter. CM following.  Expected Discharge Date:                  Expected Discharge Plan:     In-House Referral:     Discharge planning Services     Post Acute Care Choice:    Choice offered to:     DME Arranged:    DME Agency:     HH Arranged:    HH Agency:     Status of Service:  In process, will continue to follow  If discussed at Long Length of Stay Meetings, dates discussed:    Additional Comments:  Kermit BaloKelli F Ellias Mcelreath, RN 01/20/2018, 10:38 AM

## 2018-01-20 NOTE — Progress Notes (Signed)
Pt discharge education and instructions completed with pt and daughter. Pt IV and telemetry removed; pt foley removed upon daughter's request and MD order. Pt discharge home with daughter to transport her home. Pt's daughter educated on pt's nasal pcr positive for MRSA and the treatment per protocol. Pt transported off unit via wheelchair with daughter and belongings to the side. Dionne BucyP. Amo Abcde Oneil RN

## 2018-01-20 NOTE — Care Management Note (Signed)
Case Management Note  Patient Details  Name: Lisa Griffin MRN: [id number] Date of Birth: [birth year]  Subjective/Objective:                    Action/Plan: Family wanting patient to d/c home tonight. They have decided not to have home hospice. [facility] CM spoke to Dr Jacky KindleAronson about pt being HRI d/t multiple hospital visits and he was in agreement. Family asking to use [facility]. Lupita LeashDonna notified and accepted pt as HRI.  Family will provide transportation home.   Expected Discharge Date:                  Expected Discharge Plan:  Home w Hospice Care  In-House Referral:     Discharge planning Services  CM Consult  Post Acute Care Choice:  Home Health Choice offered to:  Adult Children  DME Arranged:    DME Agency:     HH Arranged:  RN, Social Work [employer] Agency:  [facility](HRI)  Status of Service:  Completed, signed off  If discussed at [employer] Length of [employer] Meetings, dates discussed:    Additional Comments:  [name], RN 01/20/2018, 4:11 PM

## 2018-01-20 NOTE — Progress Notes (Signed)
Pt unable to void. Bladder scan revealed >55350mls in bladder. NP on call notified and orders given to insert foley catheter.

## 2018-01-20 NOTE — Progress Notes (Addendum)
CM consulted for home with hospice per MD. CM met with the patients daughter at the bedside and provided her choice of Home Hospice agencies. She selected [facility]. CM left message with Bevely Palmer of [facility] about the referral. CM following.  1400: received phone call from [place] with [facility] and they are going to meet with the family in the room. Daughter updated.

## 2018-01-20 NOTE — Discharge Summary (Addendum)
Physician Discharge Summary  Lisa Griffin ZOX:[id number] DOB: June 12, 1928 DOA: 01/19/2018  PCP: Patient, No Pcp Per  Admit date: 01/19/2018 Discharge date: 01/21/2018  Admitted From: Home Disposition:  Home   Recommendations for Outpatient Follow-up:  1. Follow up with PCP in 1 week 2. Repeat CXR as outpatient to ensure resolution pneumonia  3. Follow up final blood culture results  4. Recommend home hospice to family for quality of life   Home Health: RN SW  Discharge Condition: Stable, but guarded CODE STATUS: DNR  Diet recommendation: Heart healthy    Brief/Interim Summary: Lisa Griffin is a 84 yo female with past medical history significant for diastolic CHF, hyponatremia, urinary retention, A Fib, hypothyroidism, who presents with chief complaint of fever, fatigue, congestion, cough. She was recently hospitalized at [facility] due to hyponatremia secondary to poor oral intake.  She was discharged home on 1/19.  In the emergency department, chest x-ray revealed left basilar pneumonia.  She was admitted and started on IV antibiotics to cover for healthcare acquired pneumonia.  Patient's daughter and I had extensive conversation regarding patient's multiple hospitalization, dementia as well as behavioral disturbances, multiple medical comorbidities.  We discussed that patient at this point seems to be suffering with low quality of life especially being back and forth in the hospital.  Daughter was very realistic and tearful throughout our conversation.  We discussed meaning of hospice and palliative care.  She was in agreement for consideration of hospice for home once patient is discharged and to avoid returning to the hospital and to keep her as comfortable as possible with providing quality of life at home.  Also discussed CODE STATUS and she was in agreement with DO NOT RESUSCITATE.  After meeting with hospice agency, family has now decided against hospice for now and to consider  it in the future. They were very adamant about getting patient home today due to patient's dementia and discomfort being in the hospital.    Discharge Diagnoses:  Active Problems:   Healthcare-associated pneumonia  HCAP -Influenza negative  -Blood cultures negative to date  -Vanco, cefepime --> discharged on vantin PO  -WBC improved and afebrile   AKI -Improved. Continue oral hydration.   Acute urinary retention -Continue flomax  -Foley placed overnight, removed prior to discharge per family request   A Fib -Continue eliquis, digoxin, cardizem   CAD -Continue imdur   Hypothyroidism -Continue synthroid   HTN -Continue benazepril   Dementia / mood disorders -Continue librium, elavil, lexapro   Chronic pain -Continue norco, neurontin   GERD -Continue PPI  HLD -Continue pravachol   Discharge Instructions  Discharge Instructions    Call MD for:  difficulty breathing, headache or visual disturbances   Complete by:  As directed    Call MD for:  extreme fatigue   Complete by:  As directed    Call MD for:  persistant dizziness or light-headedness   Complete by:  As directed    Call MD for:  persistant nausea and vomiting   Complete by:  As directed    Call MD for:  severe uncontrolled pain   Complete by:  As directed    Call MD for:  temperature >100.4   Complete by:  As directed    Diet - low sodium heart healthy   Complete by:  As directed    Increase activity slowly   Complete by:  As directed      Allergies as of 01/20/2018  Reactions   Metoclopramide Hives   Promethazine Hives   Codeine Nausea Only      Medication List    TAKE these medications   benazepril 20 MG tablet Commonly known as:  LOTENSIN Take 20 mg daily by mouth.   cefpodoxime 100 MG tablet Commonly known as:  VANTIN Take 1 tablet (100 mg total) by mouth 2 (two) times daily.   chlordiazePOXIDE-amitriptyline 10-25 MG Tabs tablet Commonly known as:  LIMBITROL  DS Take 1 tablet every evening by mouth.   digoxin 0.125 MG tablet Commonly known as:  LANOXIN Take 0.125 mg every evening by mouth.   diltiazem 240 MG 24 hr capsule Commonly known as:  TIAZAC Take 240 mg daily by mouth.   ELIQUIS 2.5 MG Tabs tablet Generic drug:  apixaban Take 2.5 mg 2 (two) times daily by mouth.   escitalopram 10 MG tablet Commonly known as:  LEXAPRO Take 10 mg daily by mouth.   estradiol 2 MG tablet Commonly known as:  ESTRACE Take 2 mg daily by mouth.   gabapentin 100 MG capsule Commonly known as:  NEURONTIN Take 100 mg at bedtime by mouth.   HYDROcodone-acetaminophen 10-325 MG tablet Commonly known as:  NORCO Take 1 tablet every 6 (six) hours as needed by mouth for moderate pain or severe pain.   levothyroxine 75 MCG tablet Commonly known as:  SYNTHROID, LEVOTHROID Take 75 mcg daily before breakfast by mouth.   multivitamin capsule Take 1 capsule by mouth daily.   omeprazole 40 MG capsule Commonly known as:  PRILOSEC Take 40 mg 2 (two) times daily by mouth.   ondansetron 4 MG disintegrating tablet Commonly known as:  ZOFRAN-ODT Take 4 mg every 8 (eight) hours as needed by mouth for nausea or vomiting.   polyethylene glycol packet Commonly known as:  MIRALAX / GLYCOLAX Take 17 g daily as needed by mouth for moderate constipation.   pravastatin 20 MG tablet Commonly known as:  PRAVACHOL Take 20 mg every evening by mouth.   tamsulosin 0.4 MG Caps capsule Commonly known as:  FLOMAX Take 1 capsule (0.4 mg total) by mouth daily.   Vitamin D3 1000 units Caps Take 1 capsule daily by mouth.      Follow-up Information    [facility] - Dme Follow up.   Why:  They will contact you for the first visit. Contact information: [address] [place] [address] [phone]          Allergies  Allergen Reactions  . Metoclopramide Hives  . Promethazine Hives  . Codeine Nausea Only    Consultations:  None     Procedures/Studies: Dg Chest 2 View  Result Date: 01/04/2018 CLINICAL DATA:  Productive cough for 10 days EXAM: CHEST  2 VIEW COMPARISON:  11/09/2017 FINDINGS: There is no focal consolidation. There is mild left lower lobe atelectasis. There is no pleural effusion or pneumothorax. There is stable cardiomegaly. There is a small hiatal hernia. The osseous structures are unremarkable. IMPRESSION: No active cardiopulmonary disease. Electronically Signed   By: Elige Ko   On: 01/04/2018 19:23   Dg Chest Port 1 View  Result Date: 01/19/2018 CLINICAL DATA:  Patient with lethargy and productive cough. EXAM: PORTABLE CHEST 1 VIEW COMPARISON:  Chest radiograph 01/05/2018 FINDINGS: Multiple monitoring leads overlie the patient. Stable cardiomegaly. Heterogeneous opacities left lung base. Small left pleural effusion. No pneumothorax. IMPRESSION: Heterogeneous opacities left lung base may represent pneumonia. Probable small left pleural effusion. Followup PA and lateral chest  X-ray is recommended in 3-4 weeks following trial of antibiotic therapy to ensure resolution and exclude underlying malignancy. Electronically Signed   By: Annia Belt M.D.   On: 01/19/2018 19:44   Dg Chest Port 1 View  Result Date: 01/05/2018 CLINICAL DATA:  Shortness of breath EXAM: PORTABLE CHEST 1 VIEW COMPARISON:  PA and lateral chest x-ray of January 04, 2018 and November 09, 2017. FINDINGS: The lungs are adequately inflated. There is linear increased density in the mid and lower lung zones bilaterally. There is no alveolar infiltrate or pleural effusion. The heart is mildly enlarged. The pulmonary vascularity is not engorged. There calcification in the wall of the aortic arch. The observed bony thorax exhibits no acute abnormality. IMPRESSION: Subsegmental atelectasis bilaterally. No alveolar pneumonia nor pulmonary edema. Mild cardiac enlargement which is not a new finding. Thoracic aortic atherosclerosis. Electronically Signed    By: David  [place] M.D.   On: 01/05/2018 07:35      Discharge Exam: Vitals:   01/20/18 0957 01/20/18 1446  BP:    Pulse: 85   Resp: 20   Temp: 98 F (36.7 C)   SpO2: 100% 94%   Vitals:   01/20/18 0526 01/20/18 0918 01/20/18 0957 01/20/18 1446  BP: (!) 119/43     Pulse: 69  85   Resp: 20  20   Temp: 98.7 F (37.1 C)  98 F (36.7 C)   TempSrc: Axillary  Axillary   SpO2: 100% (!) 88% 100% 94%    General: Pt is alert, awake, visibly uncomfortable, frail  Cardiovascular: RRR, S1/S2 +, no rubs, no gallops Respiratory: CTA bilaterally anteriorly, no wheezing, no rhonchi, no distress Abdominal: Soft, NT, ND, bowel sounds + Extremities: no edema, no cyanosis Skin: very thin and fragile with bruising and skin tears throughout     The results of significant diagnostics from this hospitalization (including imaging, microbiology, ancillary and laboratory) are listed below for reference.     Microbiology: Recent Results (from the past 240 hour(s))  Urine culture     Status: None   Collection Time: 01/19/18  6:58 PM  Result Value Ref Range Status   Specimen Description URINE, CLEAN CATCH  Final   Special Requests NONE  Final   Culture   Final    NO GROWTH Performed at [facility], [address], [place], [place] [address]    Report Status 01/21/2018 FINAL  Final  Blood Culture (routine x 2)     Status: None (Preliminary result)   Collection Time: 01/19/18  7:05 PM  Result Value Ref Range Status   Specimen Description BLOOD SITE NOT SPECIFIED  Final   Special Requests   Final    BOTTLES DRAWN AEROBIC AND ANAEROBIC Blood Culture adequate volume   Culture   Final    NO GROWTH 2 DAYS Performed at [facility], [address] [place], [place] [address]    Report Status PENDING  Incomplete  Blood Culture (routine x 2)     Status: None (Preliminary result)   Collection Time: 01/19/18  8:55 PM  Result Value Ref Range Status   Specimen Description BLOOD RIGHT  WRIST  Final   Special Requests IN PEDIATRIC BOTTLE Blood Culture adequate volume  Final   Culture   Final    NO GROWTH 2 DAYS Performed at [facility], [address] [place], [place] [address]    Report Status PENDING  Incomplete  MRSA PCR Screening     Status:  Abnormal   Collection Time: 01/20/18  2:09 PM  Result Value Ref Range Status   MRSA by PCR POSITIVE (A) NEGATIVE Final    Comment:        The GeneXpert MRSA Assay (FDA approved for NASAL specimens only), is one component of a comprehensive MRSA colonization surveillance program. It is not intended to diagnose MRSA infection nor to guide or monitor treatment for MRSA infections. RESULT CALLED TO, READ BACK BY AND VERIFIED WITH: S. Aur RN 16:30 01/20/18 ([email or web address])      Labs: BNP (last 3 results) Recent Labs    01/05/18 0427  BNP 193.8*   Basic Metabolic Panel: Recent Labs  Lab 01/19/18 1905 01/20/18 0328  NA 133* 136  K 4.8 4.4  CL 100* 108  CO2 23 20*  GLUCOSE 120* 101*  BUN 18 17  CREATININE 1.60* 1.28*  CALCIUM 9.0 7.9*   Liver Function Tests: Recent Labs  Lab 01/19/18 1905  AST 32  ALT 19  ALKPHOS 56  BILITOT 1.0  PROT 5.6*  ALBUMIN 3.0*   No results for input(s): LIPASE, AMYLASE in the last 168 hours. No results for input(s): AMMONIA in the last 168 hours. CBC: Recent Labs  Lab 01/19/18 1905 01/20/18 0328  WBC 13.4* 10.7*  NEUTROABS 10.4*  --   HGB 10.8* 9.1*  HCT 33.7* 28.2*  MCV 93.4 96.2  PLT 221 167   Cardiac Enzymes: No results for input(s): CKTOTAL, CKMB, CKMBINDEX, TROPONINI in the last 168 hours. BNP: Invalid input(s): POCBNP CBG: Recent Labs  Lab 01/19/18 1905  GLUCAP 117*   D-Dimer No results for input(s): DDIMER in the last 72 hours. Hgb A1c No results for input(s): HGBA1C in the last 72 hours. Lipid Profile No results for input(s): CHOL, HDL, LDLCALC, TRIG, CHOLHDL, LDLDIRECT in the last 72 hours. Thyroid function studies No results for input(s):  TSH, T4TOTAL, T3FREE, THYROIDAB in the last 72 hours.  Invalid input(s): FREET3 Anemia work up No results for input(s): VITAMINB12, FOLATE, FERRITIN, TIBC, IRON, RETICCTPCT in the last 72 hours. Urinalysis    Component Value Date/Time   COLORURINE YELLOW 01/19/2018 1937   APPEARANCEUR CLEAR 01/19/2018 1937   LABSPEC 1.013 01/19/2018 1937   PHURINE 6.0 01/19/2018 1937   GLUCOSEU NEGATIVE 01/19/2018 1937   HGBUR NEGATIVE 01/19/2018 1937   BILIRUBINUR NEGATIVE 01/19/2018 1937   KETONESUR NEGATIVE 01/19/2018 1937   PROTEINUR NEGATIVE 01/19/2018 1937   NITRITE NEGATIVE 01/19/2018 1937   LEUKOCYTESUR NEGATIVE 01/19/2018 1937   Sepsis Labs Invalid input(s): PROCALCITONIN,  WBC,  LACTICIDVEN Microbiology Recent Results (from the past 240 hour(s))  Urine culture     Status: None   Collection Time: 01/19/18  6:58 PM  Result Value Ref Range Status   Specimen Description URINE, CLEAN CATCH  Final   Special Requests NONE  Final   Culture   Final    NO GROWTH Performed at [facility], [address] [place], [place] [address]    Report Status 01/21/2018 FINAL  Final  Blood Culture (routine x 2)     Status: None (Preliminary result)   Collection Time: 01/19/18  7:05 PM  Result Value Ref Range Status   Specimen Description BLOOD SITE NOT SPECIFIED  Final   Special Requests   Final    BOTTLES DRAWN AEROBIC AND ANAEROBIC Blood Culture adequate volume   Culture   Final    NO GROWTH 2 DAYS Performed at [facility], [address] [place], [place] [address]  Report Status PENDING  Incomplete  Blood Culture (routine x 2)     Status: None (Preliminary result)   Collection Time: 01/19/18  8:55 PM  Result Value Ref Range Status   Specimen Description BLOOD RIGHT WRIST  Final   Special Requests IN PEDIATRIC BOTTLE Blood Culture adequate volume  Final   Culture   Final    NO GROWTH 2 DAYS Performed at [facility], [address] [place], [place] [address]     Report Status PENDING  Incomplete  MRSA PCR Screening     Status: Abnormal   Collection Time: 01/20/18  2:09 PM  Result Value Ref Range Status   MRSA by PCR POSITIVE (A) NEGATIVE Final    Comment:        The GeneXpert MRSA Assay (FDA approved for NASAL specimens only), is one component of a comprehensive MRSA colonization surveillance program. It is not intended to diagnose MRSA infection nor to guide or monitor treatment for MRSA infections. RESULT CALLED TO, READ BACK BY AND VERIFIED WITH: S. Aur RN 16:30 01/20/18 ([email or web address])      Time coordinating discharge: 50 minutes  SIGNED:  Noralee StainJennifer Heleena Miceli, DO [facility] Pager [phone]  If 7PM-7AM, please contact night-coverage [email or web address] Password [email or web address] 01/21/2018, 11:58 AM

## 2018-01-20 NOTE — Progress Notes (Signed)
PROGRESS NOTE    Lisa Griffin  ZOX:[id number] DOB: 28-Oct-1928 DOA: 01/19/2018 PCP: Patient, No Pcp Per     Brief Narrative:  Lisa Griffin is a 84 yo female with past medical history significant for diastolic CHF, hyponatremia, urinary retention, A Fib, hypothyroidism, who presents with chief complaint of fever, fatigue, congestion, cough. She was recently hospitalized at [facility] due to hyponatremia secondary to poor oral intake.  She was discharged home on 1/19.  In the emergency department, chest x-ray revealed left basilar pneumonia.  She was admitted and started on IV antibiotics to cover for healthcare acquired pneumonia.  Assessment & Plan:   Active Problems:   Healthcare-associated pneumonia   HCAP -Influenza negative  -Blood cultures pending  -Vanco, cefepime  AKI -Improved. Continue IVF   Acute urinary retention -Continue flomax  -Foley placed overnight  A Fib -Continue eliquis, digoxin, cardizem   CAD -Continue imdur   Hypothyroidism -Continue synthroid   HTN -Continue benazepril   Dementia / mood disorders -Continue librium, elavil, lexapro   Chronic pain -Continue norco, neurontin   GERD -Continue PPI  HLD -Continue pravachol   Goals of care Patient's daughter and I had extensive conversation regarding patient's multiple hospitalization, dementia as well as behavioral disturbances, multiple medical comorbidities.  We discussed that patient at this point seems to be suffering with low quality of life especially being back and forth in the hospital.  Daughter was very realistic and tearful throughout our conversation.  We discussed meaning of hospice and palliative care.  She was in agreement for consideration of hospice for home once patient is discharged and to avoid returning to the hospital and to keep her as comfortable as possible with providing quality of life at home.  Also discussed CODE STATUS and she was in agreement with DO NOT  RESUSCITATE.   DVT prophylaxis: Eliquis Code Status: DNR. Discussed with daughter who is HCPOA.  Family Communication: Daughter at bedside Disposition Plan: Likely home with home hospice on discharge. SW consulted.    Consultants:   None  Procedures:   None   Antimicrobials:  Anti-infectives (From admission, onward)   Start     Dose/Rate Route Frequency Ordered Stop   01/20/18 2100  vancomycin (VANCOCIN) IVPB 750 mg/150 ml premix     750 mg 150 mL/hr over 60 Minutes Intravenous Every 24 hours 01/19/18 2033     01/20/18 2100  ceFEPIme (MAXIPIME) 1 g in dextrose 5 % 50 mL IVPB     1 g 100 mL/hr over 30 Minutes Intravenous Every 24 hours 01/19/18 2109     01/19/18 2030  cefTRIAXone (ROCEPHIN) 1 g in dextrose 5 % 50 mL IVPB  Status:  Discontinued     1 g 100 mL/hr over 30 Minutes Intravenous  Once 01/19/18 2022 01/19/18 2026   01/19/18 2030  azithromycin (ZITHROMAX) 500 mg in dextrose 5 % 250 mL IVPB  Status:  Discontinued     500 mg 250 mL/hr over 60 Minutes Intravenous  Once 01/19/18 2022 01/19/18 2026   01/19/18 2030  ceFEPIme (MAXIPIME) 2 g in dextrose 5 % 50 mL IVPB     2 g 100 mL/hr over 30 Minutes Intravenous  Once 01/19/18 2027 01/19/18 2140   01/19/18 2030  vancomycin (VANCOCIN) IVPB 1000 mg/200 mL premix     1,000 mg 200 mL/hr over 60 Minutes Intravenous  Once 01/19/18 2027 01/19/18 2215       Subjective: ROS unable to obtain due to patient mentation.  Objective: Vitals:   01/20/18 0122 01/20/18 0526 01/20/18 0918 01/20/18 0957  BP: (!) 126/49 (!) 119/43    Pulse: 73 69  85  Resp: 18 20  20   Temp: 98.7 F (37.1 C) 98.7 F (37.1 C)  98 F (36.7 C)  TempSrc: Oral Axillary  Axillary  SpO2: 99% 100% (!) 88% 100%    Intake/Output Summary (Last 24 hours) at 01/20/2018 1253 Last data filed at 01/20/2018 0657 Gross per 24 hour  Intake 2250 ml  Output 400 ml  Net 1850 ml   There were no vitals filed for this visit.  Examination:  General exam: Appears  calm and comfortable  Respiratory system: Clear to auscultation anteriorly. Respiratory effort normal. Cardiovascular system: S1 & S2 heard. No JVD, murmurs, rubs, gallops or clicks. +1 pedal edema. Gastrointestinal system: Abdomen is nondistended, soft and nontender. No organomegaly or masses felt.  Extremities: Symmetric  Skin: Very fragile, thin skin with bruising throughout  Psychiatry: Dementia   Data Reviewed: I have personally reviewed following labs and imaging studies  CBC: Recent Labs  Lab 01/19/18 1905 01/20/18 0328  WBC 13.4* 10.7*  NEUTROABS 10.4*  --   HGB 10.8* 9.1*  HCT 33.7* 28.2*  MCV 93.4 96.2  PLT 221 167   Basic Metabolic Panel: Recent Labs  Lab 01/19/18 1905 01/20/18 0328  NA 133* 136  K 4.8 4.4  CL 100* 108  CO2 23 20*  GLUCOSE 120* 101*  BUN 18 17  CREATININE 1.60* 1.28*  CALCIUM 9.0 7.9*   GFR: Estimated Creatinine Clearance: 27.9 mL/min (A) (by C-G formula based on SCr of 1.28 mg/dL (H)). Liver Function Tests: Recent Labs  Lab 01/19/18 1905  AST 32  ALT 19  ALKPHOS 56  BILITOT 1.0  PROT 5.6*  ALBUMIN 3.0*   No results for input(s): LIPASE, AMYLASE in the last 168 hours. No results for input(s): AMMONIA in the last 168 hours. Coagulation Profile: No results for input(s): INR, PROTIME in the last 168 hours. Cardiac Enzymes: No results for input(s): CKTOTAL, CKMB, CKMBINDEX, TROPONINI in the last 168 hours. BNP (last 3 results) No results for input(s): PROBNP in the last 8760 hours. HbA1C: No results for input(s): HGBA1C in the last 72 hours. CBG: Recent Labs  Lab 01/19/18 1905  GLUCAP 117*   Lipid Profile: No results for input(s): CHOL, HDL, LDLCALC, TRIG, CHOLHDL, LDLDIRECT in the last 72 hours. Thyroid Function Tests: No results for input(s): TSH, T4TOTAL, FREET4, T3FREE, THYROIDAB in the last 72 hours. Anemia Panel: No results for input(s): VITAMINB12, FOLATE, FERRITIN, TIBC, IRON, RETICCTPCT in the last 72  hours. Sepsis Labs: Recent Labs  Lab 01/19/18 1921  LATICACIDVEN 1.03    No results found for this or any previous visit (from the past 240 hour(s)).     Radiology Studies: Dg Chest Port 1 View  Result Date: 01/19/2018 CLINICAL DATA:  Patient with lethargy and productive cough. EXAM: PORTABLE CHEST 1 VIEW COMPARISON:  Chest radiograph 01/05/2018 FINDINGS: Multiple monitoring leads overlie the patient. Stable cardiomegaly. Heterogeneous opacities left lung base. Small left pleural effusion. No pneumothorax. IMPRESSION: Heterogeneous opacities left lung base may represent pneumonia. Probable small left pleural effusion. Followup PA and lateral chest X-ray is recommended in 3-4 weeks following trial of antibiotic therapy to ensure resolution and exclude underlying malignancy. Electronically Signed   By: Annia Belt M.D.   On: 01/19/2018 19:44      Scheduled Meds: . albuterol  2.5 mg Nebulization TID  . chlordiazePOXIDE  10 mg  Oral q1800   And  . amitriptyline  25 mg Oral q1800  . apixaban  2.5 mg Oral BID  . benazepril  20 mg Oral Daily  . digoxin  0.125 mg Oral QPM  . diltiazem  240 mg Oral Daily  . escitalopram  10 mg Oral Daily  . estradiol  2 mg Oral Daily  . gabapentin  100 mg Oral QHS  . isosorbide mononitrate  30 mg Oral Daily  . levothyroxine  75 mcg Oral QAC breakfast  . mouth rinse  15 mL Mouth Rinse BID  . pantoprazole  40 mg Oral Daily  . pravastatin  20 mg Oral QPM  . sodium chloride flush  3 mL Intravenous Q12H  . tamsulosin  0.4 mg Oral Daily   Continuous Infusions: . sodium chloride 1,000 mL (01/19/18 1918)  . sodium chloride    . ceFEPime (MAXIPIME) IV    . vancomycin       LOS: 1 day    Time spent: 50 minutes   Noralee StainJennifer Audree Schrecengost, DO [facility] [email or web address] Password [facility] 01/20/2018, 12:53 PM

## 2018-01-20 NOTE — Progress Notes (Signed)
[facility] Hospital Liaison: RN visit  Notified by Beckie SaltsKelli Ward [facility] of family interest for [facility] at home at discharge.  Spoke with patient daughter at bedside   who asked her spouse Maurine MinisterDennis to speak with me privately in another room about Hospice services while she was comforting the patient who was agitated and yelling out.  Initiated education about Hospice philosophy and support services with Maurine Ministerennis.   Maurine MinisterDennis verbalized understanding of information given but is unsure the family is ready to seek just comfort care at this time.  After answering questions, listening and giving him [facility] contact information along with options of Palliative Care Services as well,  Maurine MinisterDennis decided to discuss the next steps with patient Daughter Gavin PoundDeborah at this time and will let the [facility] or [facility] know if they decide to use our services.   Updated CMRN and will wait patient family decision. Please call with any hospice related questions or if [facility] can support the patient.    Thank you for this referral,   Roda ShuttersSherry Gibson, RN [facility] Hospital Liaison [phone]  Hospital Liaisons found on AMION

## 2018-01-21 LAB — URINE CULTURE: CULTURE: NO GROWTH

## 2018-01-21 LAB — LEGIONELLA PNEUMOPHILA SEROGP 1 UR AG: L. pneumophila Serogp 1 Ur Ag: NEGATIVE

## 2018-01-24 LAB — CULTURE, BLOOD (ROUTINE X 2)
Culture: NO GROWTH
Culture: NO GROWTH
SPECIAL REQUESTS: ADEQUATE
SPECIAL REQUESTS: ADEQUATE

## 2018-01-31 ENCOUNTER — Encounter: Payer: Medicare Other | Admitting: Podiatry

## 2018-02-14 ENCOUNTER — Ambulatory Visit (INDEPENDENT_AMBULATORY_CARE_PROVIDER_SITE_OTHER): Payer: Medicare Other | Admitting: Podiatry

## 2018-02-14 ENCOUNTER — Encounter: Payer: Self-pay | Admitting: Podiatry

## 2018-02-14 ENCOUNTER — Ambulatory Visit: Payer: Medicare Other

## 2018-02-14 DIAGNOSIS — M2042 Other hammer toe(s) (acquired), left foot: Principal | ICD-10-CM

## 2018-02-14 DIAGNOSIS — B351 Tinea unguium: Secondary | ICD-10-CM | POA: Diagnosis not present

## 2018-02-14 DIAGNOSIS — M2041 Other hammer toe(s) (acquired), right foot: Secondary | ICD-10-CM

## 2018-02-14 DIAGNOSIS — M79676 Pain in unspecified toe(s): Secondary | ICD-10-CM | POA: Diagnosis not present

## 2018-02-14 NOTE — Progress Notes (Signed)
Subjective:  Patient ID: Lisa Griffin, female    DOB: [birth year],  MRN: [id number] HPI Chief Complaint  Patient presents with  . Debridement    Requesting nail care   . Toe Pain    2nd and 3rd toes left - sore with shoes and walking  . NOTE    Patient's daughter is with her today and states she has been in the hospital with pneumonia and has a hematoma dorsal forefoot left (not sure what happened) and her skin is really thin and fragile - tender to the touch    84 y.o. female presents with the above complaint.     Past Medical History:  Diagnosis Date  . CHF (congestive heart failure) (HCC)   . LBBB (left bundle branch block)   . Rapid atrial fibrillation (HCC)   . Renal disorder   . Thyroid disease    No past surgical history on file.  Current Outpatient Medications:  .  benazepril (LOTENSIN) 20 MG tablet, Take 20 mg daily by mouth. , Disp: , Rfl:  .  cefpodoxime (VANTIN) 100 MG tablet, Take 1 tablet (100 mg total) by mouth 2 (two) times daily., Disp: 20 tablet, Rfl: 0 .  chlordiazePOXIDE-amitriptyline (LIMBITROL DS) 10-25 MG TABS tablet, Take 1 tablet every evening by mouth., Disp: , Rfl:  .  Cholecalciferol (VITAMIN D3) 1000 units CAPS, Take 1 capsule daily by mouth. , Disp: , Rfl:  .  digoxin (LANOXIN) 0.125 MG tablet, Take 0.125 mg every evening by mouth. , Disp: , Rfl:  .  diltiazem (TIAZAC) 240 MG 24 hr capsule, Take 240 mg daily by mouth. , Disp: , Rfl:  .  ELIQUIS 2.5 MG TABS tablet, Take 2.5 mg 2 (two) times daily by mouth. , Disp: , Rfl:  .  escitalopram (LEXAPRO) 10 MG tablet, Take 10 mg daily by mouth. , Disp: , Rfl:  .  estradiol (ESTRACE) 2 MG tablet, Take 2 mg daily by mouth. , Disp: , Rfl:  .  gabapentin (NEURONTIN) 100 MG capsule, Take 100 mg at bedtime by mouth. , Disp: , Rfl:  .  HYDROcodone-acetaminophen (NORCO) 10-325 MG tablet, Take 1 tablet every 6 (six) hours as needed by mouth for moderate pain or severe pain. , Disp: , Rfl:  .  levothyroxine  (SYNTHROID, LEVOTHROID) 75 MCG tablet, Take 75 mcg daily before breakfast by mouth., Disp: , Rfl:  .  Multiple Vitamin (MULTIVITAMIN) capsule, Take 1 capsule by mouth daily., Disp: , Rfl:  .  omeprazole (PRILOSEC) 40 MG capsule, Take 40 mg 2 (two) times daily by mouth., Disp: , Rfl:  .  ondansetron (ZOFRAN-ODT) 4 MG disintegrating tablet, Take 4 mg every 8 (eight) hours as needed by mouth for nausea or vomiting. , Disp: , Rfl:  .  polyethylene glycol (MIRALAX / GLYCOLAX) packet, Take 17 g daily as needed by mouth for moderate constipation. , Disp: , Rfl:  .  pravastatin (PRAVACHOL) 20 MG tablet, Take 20 mg every evening by mouth. , Disp: , Rfl:  .  tamsulosin (FLOMAX) 0.4 MG CAPS capsule, Take 1 capsule (0.4 mg total) by mouth daily., Disp: 30 capsule, Rfl: 0  Allergies  Allergen Reactions  . Metoclopramide Hives  . Promethazine Hives  . Codeine Nausea Only   Review of Systems  Constitutional: Positive for appetite change and fatigue.  HENT: Positive for hearing loss.   Eyes: Positive for visual disturbance.  Respiratory: Positive for apnea, cough and shortness of breath.   Cardiovascular: Positive for leg swelling.  Musculoskeletal: Positive for arthralgias, back pain and gait problem.  Neurological: Positive for speech difficulty and weakness.  Psychiatric/Behavioral: Positive for confusion.  All other systems reviewed and are negative.  Objective:  There were no vitals filed for this visit.  General: Well developed, nourished, in no acute distress, alert and oriented x3   Dermatological: Skin is warm, dry and supple bilateral. Nails x 10 are well maintained; remaining integument appears unremarkable at this time. There are no open sores, no preulcerative lesions, no rash or signs of infection present.  Toenails are long thick yellow dystrophic clinically mycotic.  Vascular: Dorsalis Pedis artery and Posterior Tibial artery pedal pulses are 2/4 bilateral with immedate capillary fill  time. Pedal hair growth present. No varicosities and no lower extremity edema present bilateral.   Neruologic: Grossly intact via light touch bilateral. Vibratory intact via tuning fork bilateral. Protective threshold with Semmes Wienstein monofilament intact to all pedal sites bilateral. Patellar and Achilles deep tendon reflexes 2+ bilateral. No Babinski or clonus noted bilateral.   Musculoskeletal: No gross boney pedal deformities bilateral. No pain, crepitus, or limitation noted with foot and ankle range of motion bilateral. Muscular strength 5/5 in all groups tested bilateral.  Hammertoe deformities with visible deformities to the toes.  Gait: Unassisted, Nonantalgic.    Radiographs:  None taken  Assessment & Plan:   Assessment: Pain in limb secondary to onychomycosis and hammertoe deformities with osteoarthritis.  Plan: Debridement of toenails 1 through 5 bilateral cover service secondary to pain dispensed silicone toe covers.     Terald Jump T. FreeportHyatt, [place]

## 2018-02-27 NOTE — Progress Notes (Signed)
This encounter was created in error - please disregard.

## 2018-04-19 DEATH — deceased

## 2019-01-20 IMAGING — MR MR HEAD W/O CM
8 of 10 series · 38 of 48 positions shown · non-contrast
Comparison: CT HEAD October 27, 2017 at 9291 hours

CLINICAL DATA: Progressive weakness, poor appetite, unsteady gait.

EXAM:
MRI HEAD WITHOUT CONTRAST
TECHNIQUE: Multiplanar, multiecho pulse sequences of the brain and surrounding
structures were obtained without intravenous contrast.

[Series 3: DWI · axial · 3.0mm · 1.09mm/px · z∈[-87,+72]mm · 11 of 108 slices shown (1 of 4)]
[im 1/108]
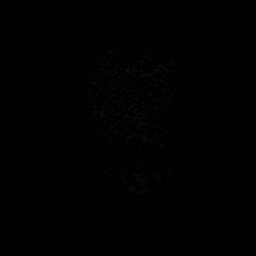
[im 11/108]
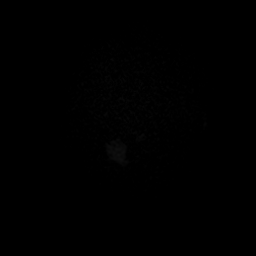
[im 22/108]
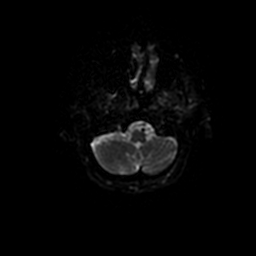
[im 33/108]
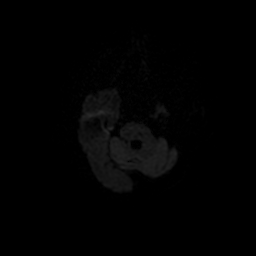
[im 43/108]
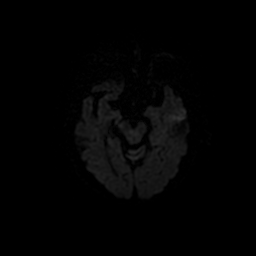
[im 54/108]
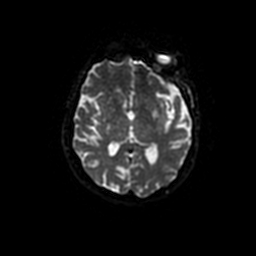
[im 65/108]
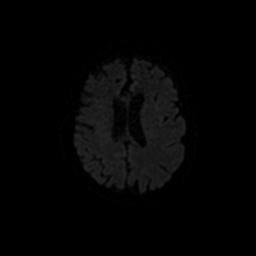
[im 75/108]
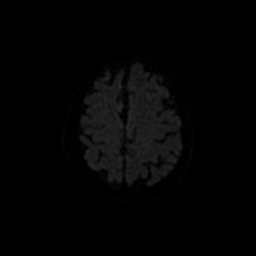
[im 86/108]
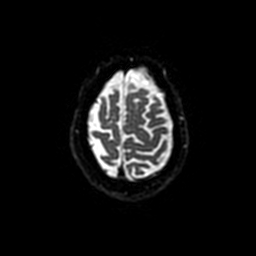
[im 97/108]
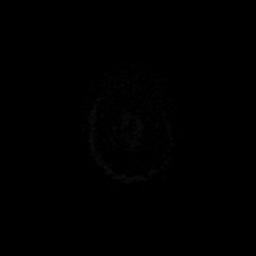
[im 108/108]
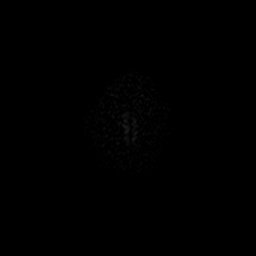

[Series 4: T1 · sagittal · 5.0mm · 0.47mm/px · 1 of 24 slices shown]
[im 1/24]
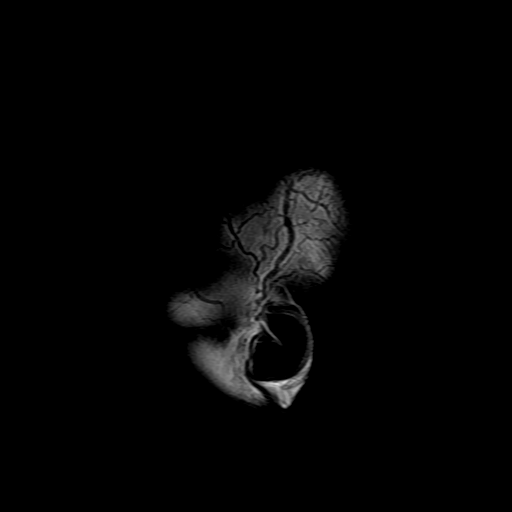

[Series 5: DWI · coronal · 5.0mm · 1.09mm/px · 7 of 68 slices shown (2 of 4)]
[im 1/68]
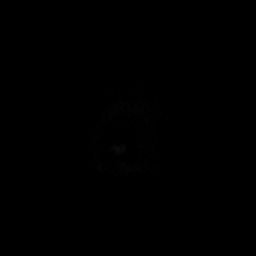
[im 12/68]
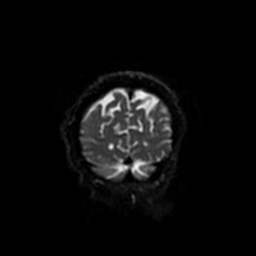
[im 23/68]
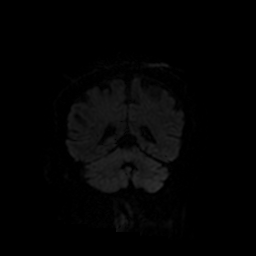
[im 34/68]
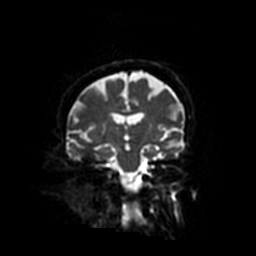
[im 45/68]
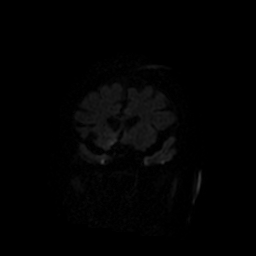
[im 56/68]
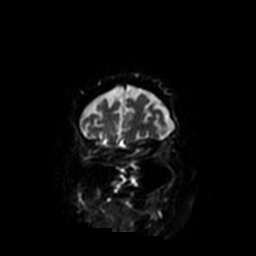
[im 68/68]
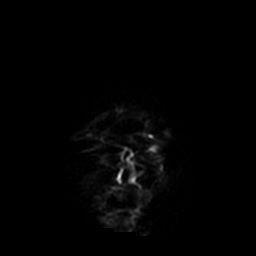

[Series 6: T2 · axial · 5.0mm · 0.43mm/px · z∈[-81,+71]mm · 3 of 23 slices shown (1 of 2)]
[im 1/23]
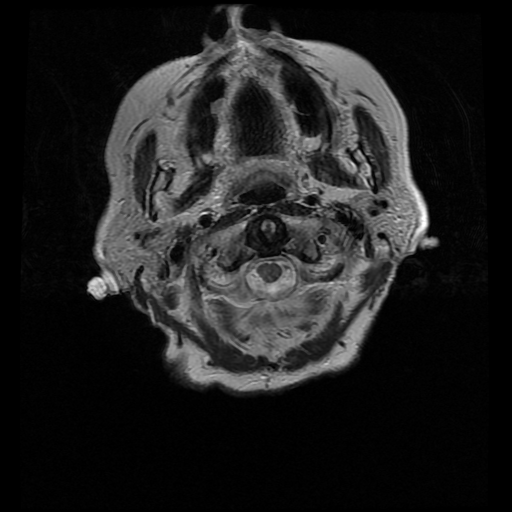
[im 12/23]
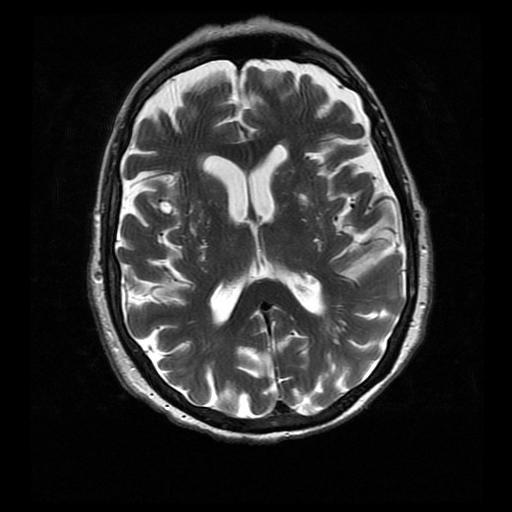
[im 23/23]
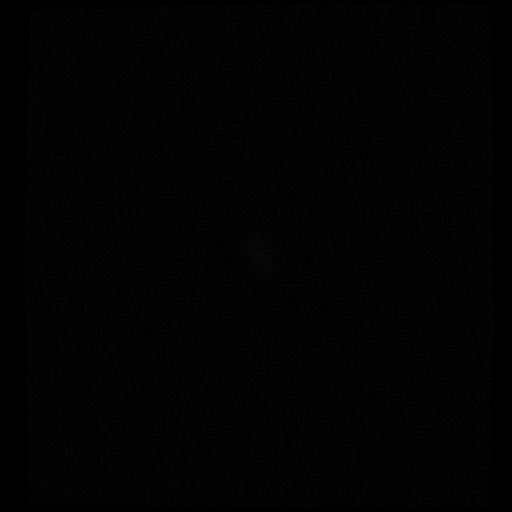

[Series 7: FLAIR · axial · 3.0mm · 0.43mm/px · z∈[-78,+64]mm · 3 of 25 slices shown]
[im 1/25]
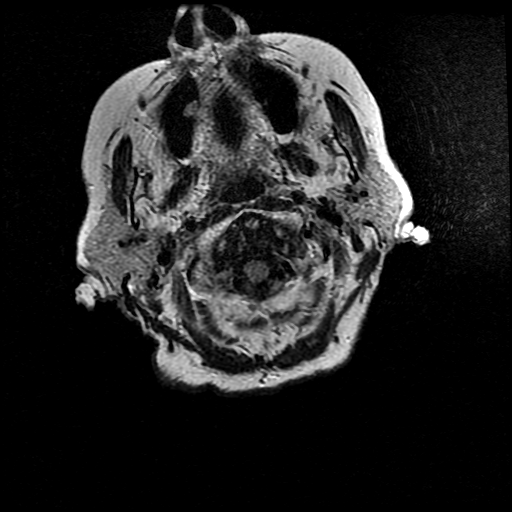
[im 13/25]
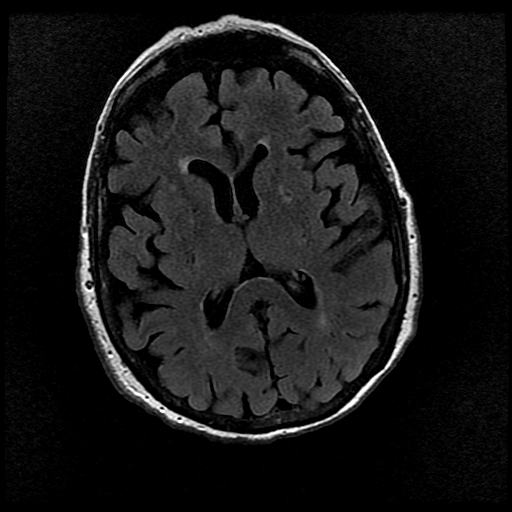
[im 25/25]
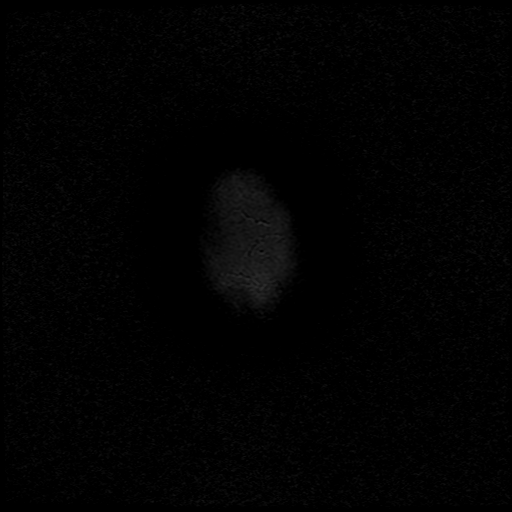

[Series 10: T2 · coronal · 5.0mm · 0.45mm/px · 3 of 27 slices shown (2 of 2)]
[im 1/27]
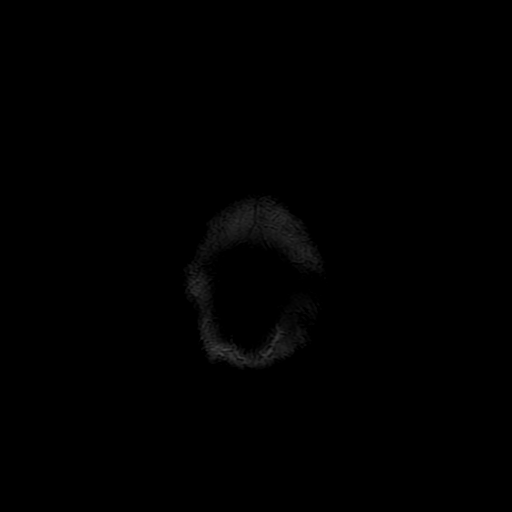
[im 14/27]
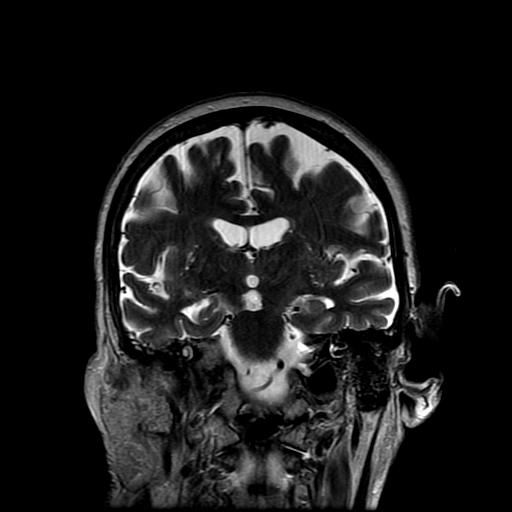
[im 27/27]
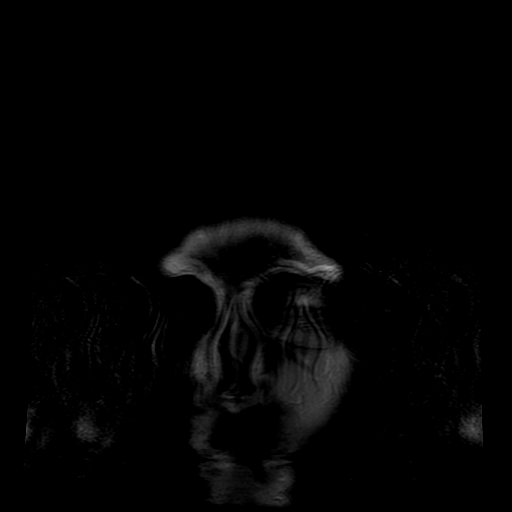

[Series 300: DWI · axial · 3.0mm · 1.09mm/px · z∈[-87,+72]mm · 6 of 54 slices shown (3 of 4)]
[im 1/54]
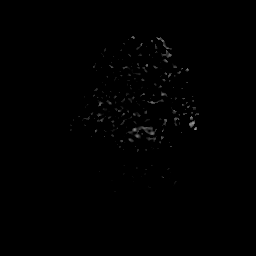
[im 11/54]
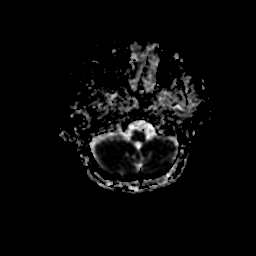
[im 22/54]
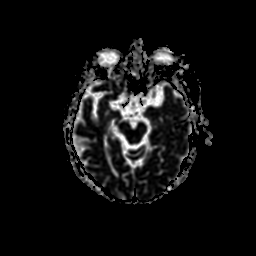
[im 32/54]
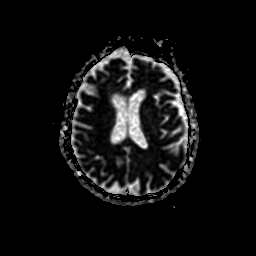
[im 43/54]
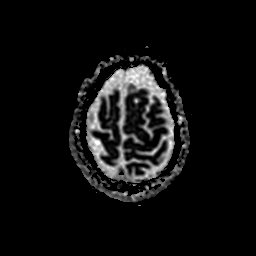
[im 54/54]
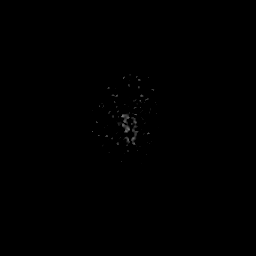

[Series 500: DWI · coronal · 5.0mm · 1.09mm/px · 4 of 34 slices shown (4 of 4)]
[im 1/34]
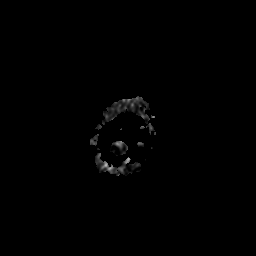
[im 12/34]
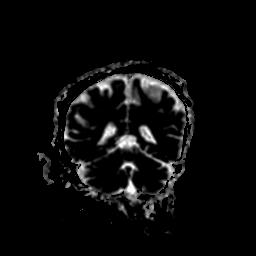
[im 23/34]
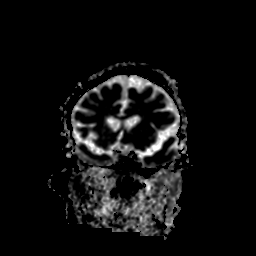
[im 34/34]
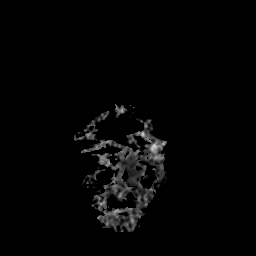

[38 of 48 positions shown; findings below may reference images not displayed]

FINDINGS: BRAIN: No reduced diffusion to suggest acute ischemia. No
susceptibility artifact to suggest hemorrhage. The ventricles and
sulci are normal for patient's age. Old bilateral basal ganglia
lacunar infarcts. A few scattered subcentimeter supratentorial white
matter FLAIR T2 hyperintensities, faint T2 hyperintense signal
within the pons. Old small cerebellar infarct . No suspicious
parenchymal signal, mass or mass effect. No abnormal extra-axial
fluid collections.

VASCULAR: Normal major intracranial vascular flow voids present at
skull base.

SKULL AND UPPER CERVICAL SPINE: No abnormal sellar expansion. No
suspicious calvarial bone marrow signal. Craniocervical junction
maintained.

SINUSES/ORBITS: Trace mastoid effusions. Paranasal sinus are well
aerated. The included ocular globes and orbital contents are
non-suspicious. Status post bilateral ocular lens implants.

OTHER: Patient is edentulous.
IMPRESSION: 1. No acute intracranial process.
2. Mild chronic small vessel ischemic disease.
3. Old basal ganglia lacunar infarcts. Small LEFT cerebellar infarct
.

## 2019-02-02 IMAGING — CR DG CHEST 2V
2 series · 2 of 2 positions shown · non-contrast
Comparison: Two-view chest x-ray 10/27/2017

CLINICAL DATA: Dehydration, weakness, low grade temperature. Cough.
Dyspnea.

EXAM:
CHEST  2 VIEW

[w chest lat]
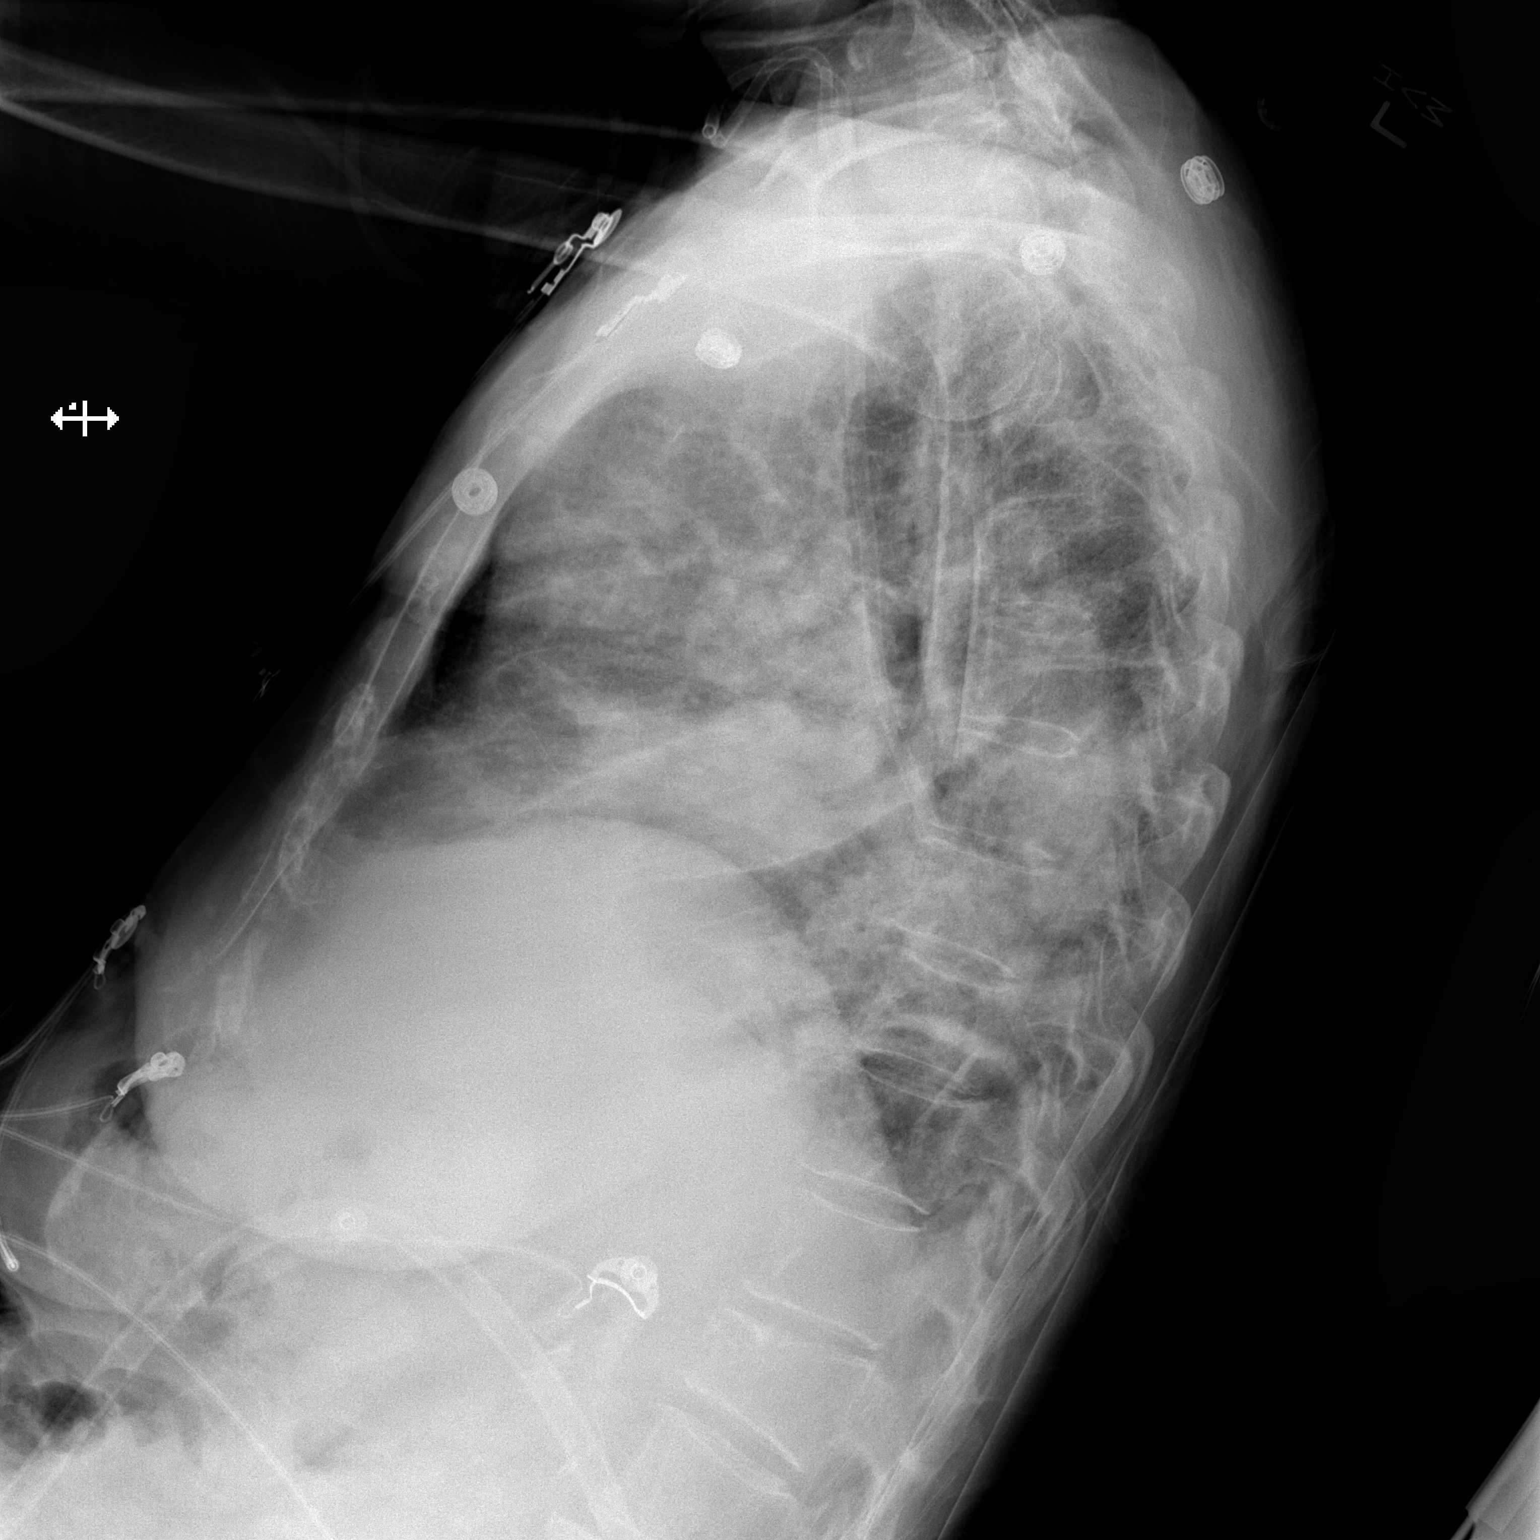

[x chest ap]
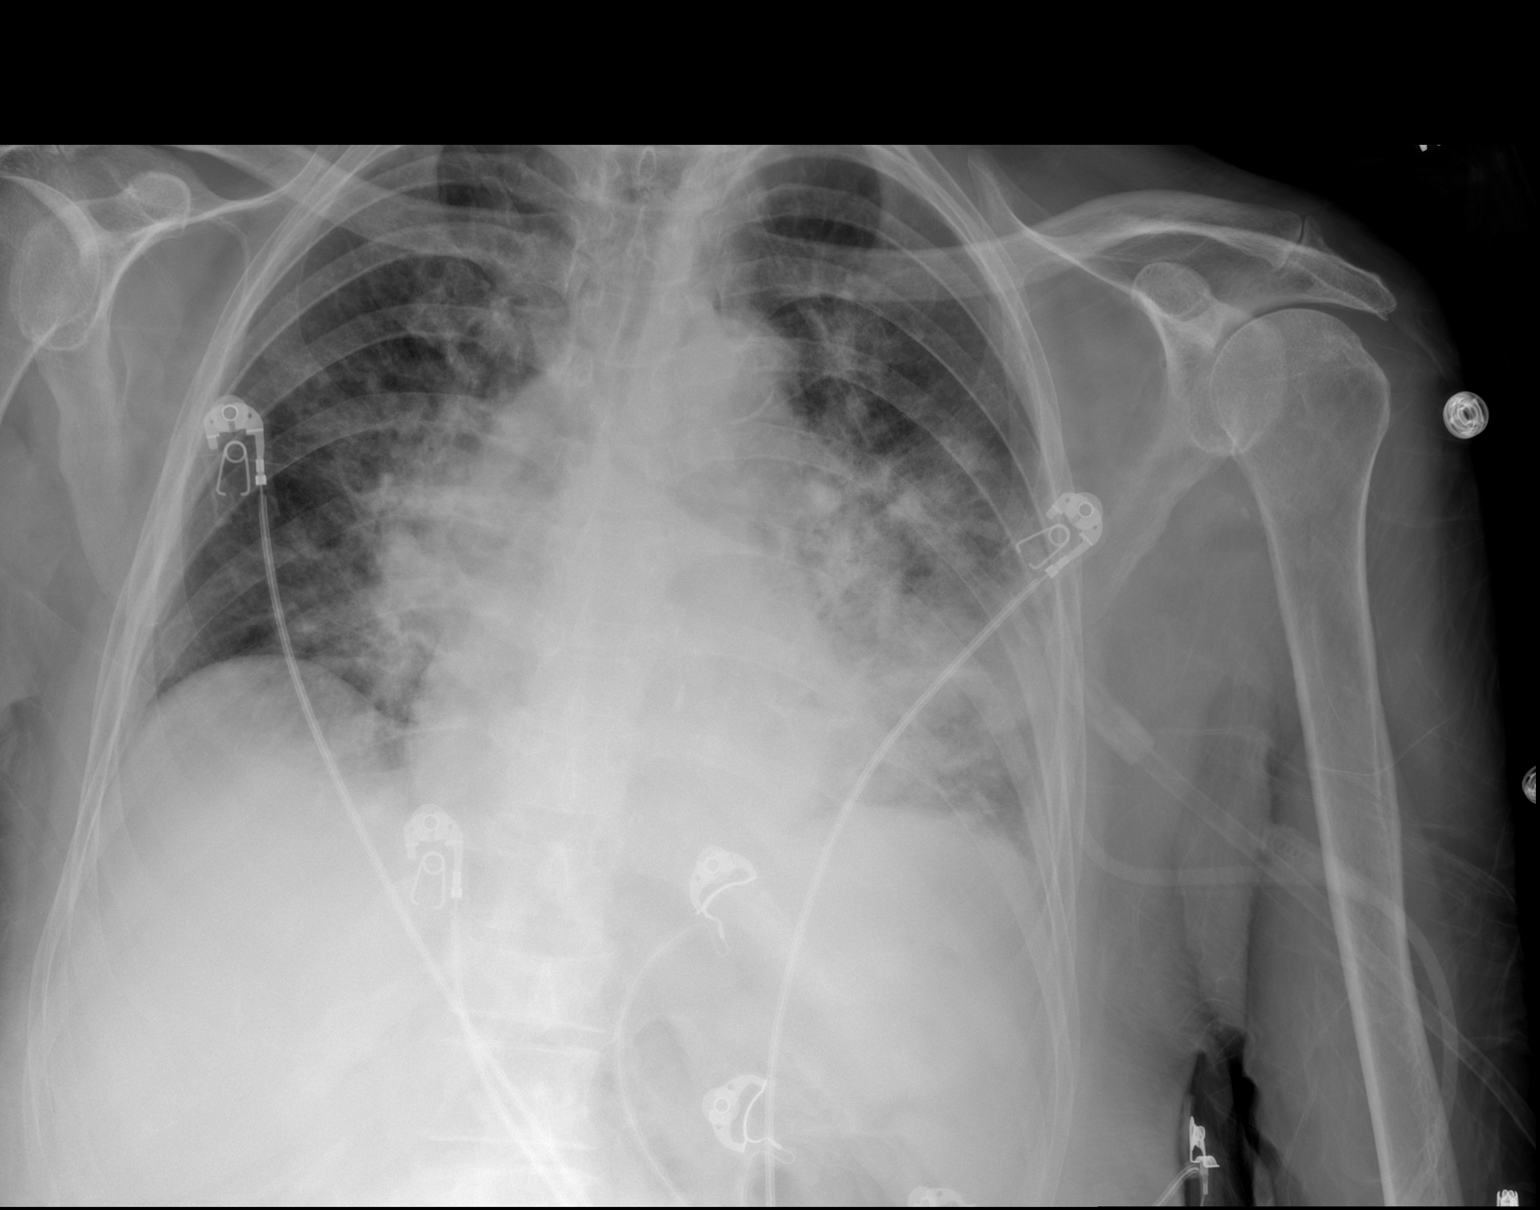

[2 of 2 positions shown; findings below may reference images not displayed]

FINDINGS: The heart is enlarged. Diffuse interstitial and airspace disease is
now present. Small effusions are present. Airspace disease is most
concentrated in the left lower lobe. The visualized soft tissues and
bony thorax are unremarkable.
IMPRESSION: 1. Left lower lobe pneumonia.
2. Increase in diffuse interstitial and airspace disease likely
representing some component of edema and possibly congestive heart
failure as well.

## 2019-03-31 IMAGING — DX DG CHEST 1V PORT
1 series · 1 of 1 positions shown · non-contrast
Comparison: PA and lateral chest x-ray January 04, 2018 and
November 09, 2017.

CLINICAL DATA: Shortness of breath

EXAM:
PORTABLE CHEST 1 VIEW

[chest ap]
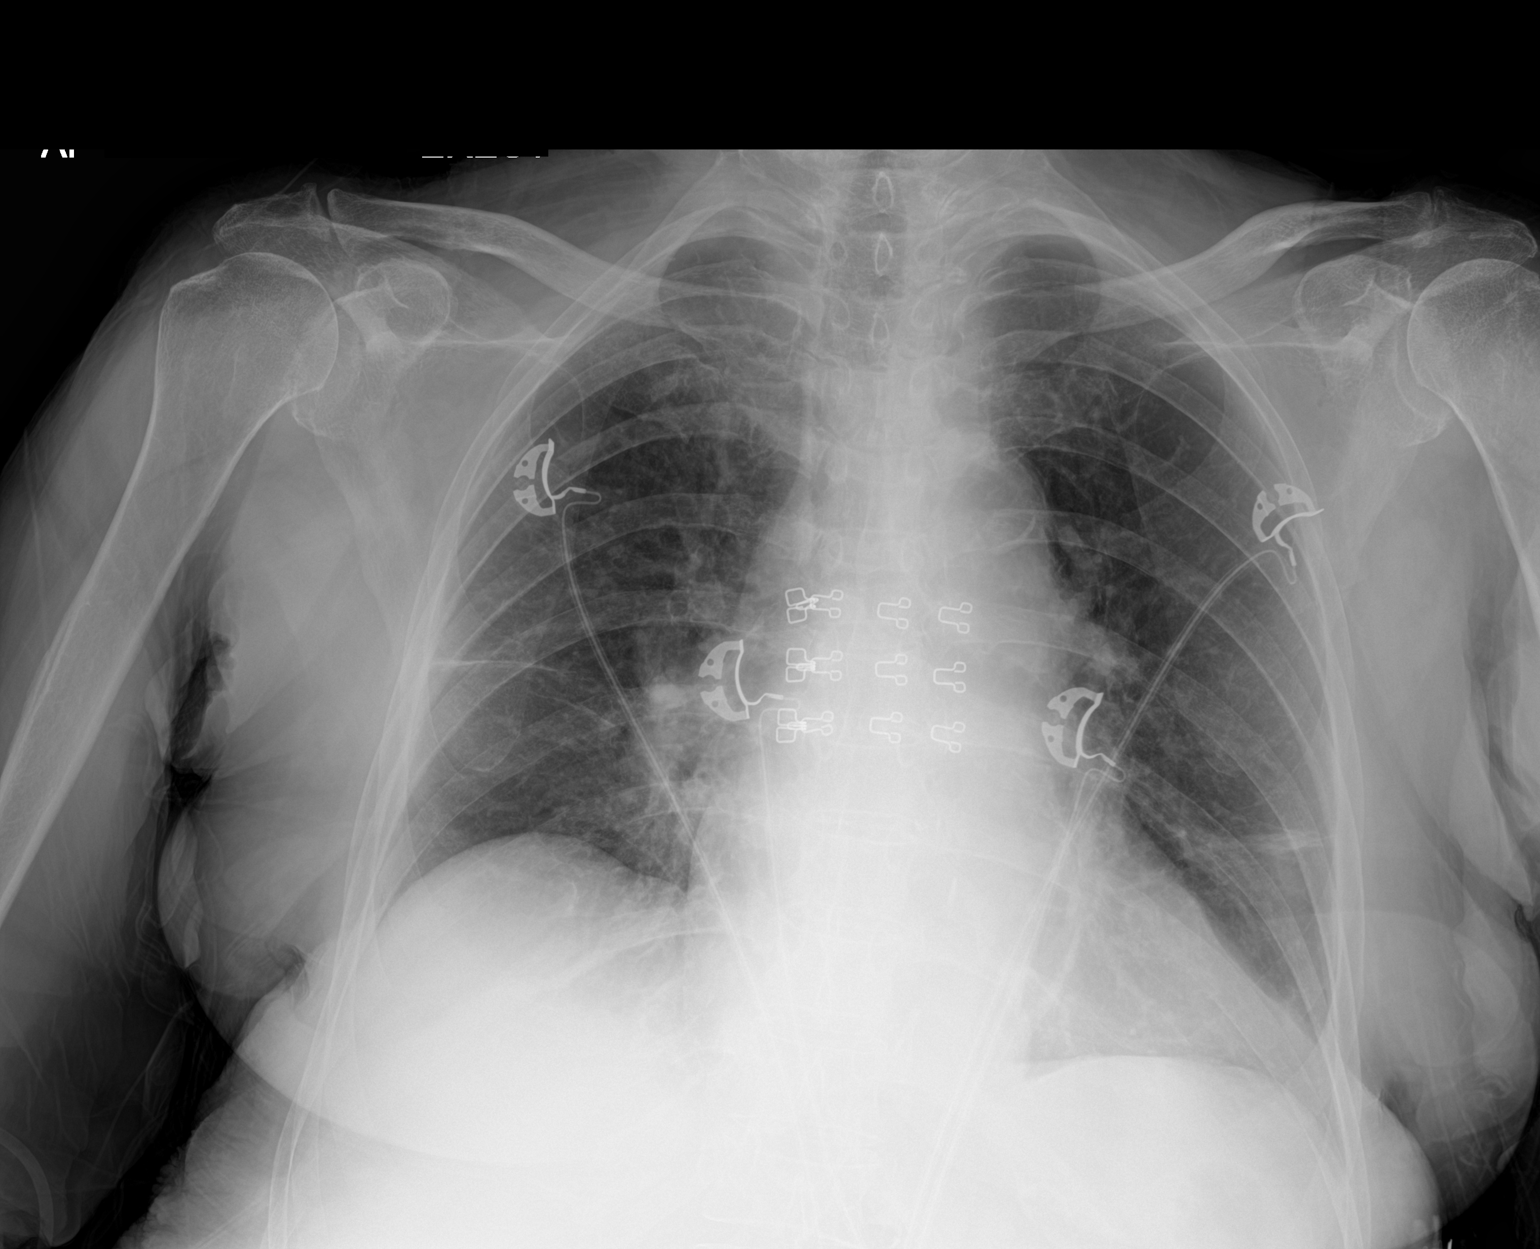

[1 of 1 positions shown; findings below may reference images not displayed]

FINDINGS: The lungs are adequately inflated. There is linear increased density
in the mid and lower lung zones bilaterally. There is no alveolar
infiltrate or pleural effusion. The heart is mildly enlarged. The
pulmonary vascularity is not engorged. There calcification in the
wall of the aortic arch. The observed bony thorax exhibits no acute
abnormality.
IMPRESSION: Subsegmental atelectasis bilaterally. No alveolar pneumonia nor
pulmonary edema. Mild cardiac enlargement which is not a new
finding.

Thoracic aortic atherosclerosis.

## 2019-04-06 ENCOUNTER — Telehealth: Payer: Self-pay

## 2019-04-06 NOTE — Telephone Encounter (Signed)
Phone call placed to patient's daughter to check in and to offer a follow up visit. VM left.

## 2019-04-09 ENCOUNTER — Telehealth: Payer: Self-pay

## 2019-04-09 NOTE — Telephone Encounter (Signed)
Received message from daughter who shared that patient has passed in 2018/04/12.
